# Patient Record
Sex: Male | Born: 1955 | Race: White | Hispanic: No | Marital: Married | Smoking: Current every day smoker
Health system: Southern US, Community
[De-identification: ages and names within clinical notes are randomized; demographics above are authoritative.]

## PROBLEM LIST (undated history)

## (undated) DIAGNOSIS — E785 Hyperlipidemia, unspecified: Secondary | ICD-10-CM

## (undated) DIAGNOSIS — E119 Type 2 diabetes mellitus without complications: Secondary | ICD-10-CM

## (undated) HISTORY — PX: THYROIDECTOMY, PARTIAL: SHX18

## (undated) HISTORY — DX: Hyperlipidemia, unspecified: E78.5

## (undated) HISTORY — DX: Type 2 diabetes mellitus without complications: E11.9

---

## 2000-01-11 ENCOUNTER — Ambulatory Visit (HOSPITAL_COMMUNITY): Admission: RE | Admit: 2000-01-11 | Discharge: 2000-01-11 | Payer: Self-pay | Admitting: *Deleted

## 2002-01-05 ENCOUNTER — Encounter: Payer: Self-pay | Admitting: General Surgery

## 2002-01-05 ENCOUNTER — Encounter: Admission: RE | Admit: 2002-01-05 | Discharge: 2002-01-05 | Payer: Self-pay | Admitting: General Surgery

## 2018-08-03 ENCOUNTER — Telehealth: Payer: Self-pay

## 2018-08-03 DIAGNOSIS — Z122 Encounter for screening for malignant neoplasm of respiratory organs: Secondary | ICD-10-CM

## 2018-08-03 DIAGNOSIS — F17210 Nicotine dependence, cigarettes, uncomplicated: Secondary | ICD-10-CM

## 2018-08-08 NOTE — Telephone Encounter (Signed)
Pt is returning [employer] phone call

## 2018-08-08 NOTE — Telephone Encounter (Signed)
LMTC x 1  

## 2018-08-09 NOTE — Telephone Encounter (Signed)
Pt is returning phone call ° °

## 2018-08-09 NOTE — Telephone Encounter (Signed)
Will route to the lung nodule pool 

## 2018-08-11 NOTE — Telephone Encounter (Signed)
LMTC x 1  

## 2018-08-15 NOTE — Telephone Encounter (Signed)
Spoke with pt and scheduled SDMV 09/20/18 9:00 CT ordered Nothing further needed

## 2018-09-20 ENCOUNTER — Ambulatory Visit (INDEPENDENT_AMBULATORY_CARE_PROVIDER_SITE_OTHER)
Admission: RE | Admit: 2018-09-20 | Discharge: 2018-09-20 | Disposition: A | Discharge status: Home / Self Care | Payer: Managed Care, Other (non HMO) | Source: Ambulatory Visit | Attending: Acute Care | Admitting: Acute Care

## 2018-09-20 ENCOUNTER — Other Ambulatory Visit: Payer: Self-pay

## 2018-09-20 ENCOUNTER — Encounter: Payer: Self-pay | Admitting: Acute Care

## 2018-09-20 ENCOUNTER — Ambulatory Visit (INDEPENDENT_AMBULATORY_CARE_PROVIDER_SITE_OTHER): Payer: Managed Care, Other (non HMO) | Admitting: Acute Care

## 2018-09-20 VITALS — BP 114/70 | HR 57 | Temp 97.6°F | Ht 73.0 in | Wt 278.4 lb

## 2018-09-20 DIAGNOSIS — Z122 Encounter for screening for malignant neoplasm of respiratory organs: Secondary | ICD-10-CM

## 2018-09-20 DIAGNOSIS — F17210 Nicotine dependence, cigarettes, uncomplicated: Secondary | ICD-10-CM | POA: Diagnosis not present

## 2018-09-20 NOTE — Progress Notes (Addendum)
Shared Decision Making Visit Lung Cancer Screening Program [phone])   Eligibility:  Age 63 y.o.  Pack Years Smoking History Calculation 33 pack year smoking history (# packs/per year x # years smoked)  Recent History of coughing up blood  no  Unexplained weight loss? no ( >Than 15 pounds within the last 6 months )  Prior History Lung / other cancer no (Diagnosis within the last 5 years already requiring surveillance chest CT Scans).  Smoking Status Current Smoker  Former Smokers: Years since quit: NA  Quit Date: NA  Visit Components:  Discussion included one or more decision making aids. yes  Discussion included risk/benefits of screening. yes  Discussion included potential follow up diagnostic testing for abnormal scans. yes  Discussion included meaning and risk of over diagnosis. yes  Discussion included meaning and risk of False Positives. yes  Discussion included meaning of total radiation exposure. yes  Counseling Included:  Importance of adherence to annual lung cancer LDCT screening. yes  Impact of comorbidities on ability to participate in the program. yes  Ability and willingness to under diagnostic treatment. yes  Smoking Cessation Counseling:  Current Smokers:   Discussed importance of smoking cessation. yes  Information about tobacco cessation classes and interventions provided to patient. yes  Patient provided with "ticket" for LDCT Scan. yes  Symptomatic Patient. no  Counseling  Diagnosis Code: Tobacco Use Z72.0  Asymptomatic Patient yes  Counseling (Intermediate counseling: > three minutes counseling) M0102  Former Smokers:   Discussed the importance of maintaining cigarette abstinence. yes  Diagnosis Code: Personal History of Nicotine Dependence. V25.366  Information about tobacco cessation classes and interventions provided to patient. Yes  Patient provided with "ticket" for LDCT Scan. yes  Written Order for Lung Cancer  Screening with LDCT placed in Epic. Yes (CT Chest Lung Cancer Screening Low Dose W/O CM) [id number] Z12.2-Screening of respiratory organs Z87.891-Personal history of nicotine dependence  BP 114/70 (BP Location: Right Arm, Patient Position: Sitting, Cuff Size: Large)   Pulse (!) 57   Temp 97.6 F (36.4 C) (Oral)   Ht 6\' 1"  (1.854 m)   Wt 278 lb 6.4 oz (126.3 kg)   SpO2 97%   BMI 36.73 kg/m     I have spent 25 minutes of face to face time with [name] discussing the risks and benefits of lung cancer screening. We viewed a power point together that explained in detail the above noted topics. We paused at intervals to allow for questions to be asked and answered to ensure understanding.We discussed that the single most powerful action that he can take to decrease his risk of developing lung cancer is to quit smoking. We discussed whether or not he is ready to commit to setting a quit date. We discussed options for tools to aid in quitting smoking including nicotine replacement therapy, non-nicotine medications, support groups, Quit Smart classes, and behavior modification. We discussed that often times setting smaller, more achievable goals, such as eliminating 1 cigarette a day for a week and then 2 cigarettes a day for a week can be helpful in slowly decreasing the number of cigarettes smoked. This allows for a sense of accomplishment as well as providing a clinical benefit. I gave him the " Be Stronger Than Your Excuses" card with contact information for community resources, classes, free nicotine replacement therapy, and access to mobile apps, text messaging, and on-line smoking cessation help. I have also given him my card and contact information in the event he needs to  contact me. We discussed the time and location of the scan, and that either [name] or I will call with the results within 24-48 hours of receiving them. I have offered him  a copy of the power point we viewed  as a  resource in the event they need reinforcement of the concepts we discussed today in the office. The patient verbalized understanding of all of  the above and had no further questions upon leaving the office. They have my contact information in the event they have any further questions.  I spent 5 minutes counseling on smoking cessation and the health risks of continued tobacco abuse.  I explained to the patient that there has been a high incidence of coronary artery disease noted on these exams. I explained that this is a non-gated exam therefore degree or severity cannot be determined. This patient is on statin therapy. I have asked the patient to follow-up with their PCP regarding any incidental finding of coronary artery disease and management with diet or medication as their PCP  feels is clinically indicated. The patient verbalized understanding of the above and had no further questions upon completion of the visit.      Bevelyn NgoSarah F Groce, NP 09/20/2018 9:31 AM

## 2018-09-22 ENCOUNTER — Telehealth: Payer: Self-pay | Admitting: Acute Care

## 2018-09-22 DIAGNOSIS — F17210 Nicotine dependence, cigarettes, uncomplicated: Secondary | ICD-10-CM

## 2018-09-22 DIAGNOSIS — Z122 Encounter for screening for malignant neoplasm of respiratory organs: Secondary | ICD-10-CM

## 2018-09-22 NOTE — Telephone Encounter (Signed)
Spoke to pt regarding Chest CT.  Advised per Eric Form, NP he needs a televisit to discuss results.  Televisit scheduled 09/25/18 9:00 with SG.  Pt verbalized understanding.  Order placed for 1 yr f/u low dose CT.  Results sent to PCP.

## 2018-09-22 NOTE — Telephone Encounter (Signed)
LMTC  X 1  

## 2018-09-25 ENCOUNTER — Other Ambulatory Visit: Payer: Self-pay

## 2018-09-25 ENCOUNTER — Ambulatory Visit (INDEPENDENT_AMBULATORY_CARE_PROVIDER_SITE_OTHER): Payer: Managed Care, Other (non HMO) | Admitting: Acute Care

## 2018-09-25 ENCOUNTER — Encounter: Payer: Self-pay | Admitting: Acute Care

## 2018-09-25 ENCOUNTER — Other Ambulatory Visit: Payer: Self-pay | Admitting: Family Medicine

## 2018-09-25 DIAGNOSIS — E049 Nontoxic goiter, unspecified: Secondary | ICD-10-CM | POA: Diagnosis not present

## 2018-09-25 DIAGNOSIS — Z122 Encounter for screening for malignant neoplasm of respiratory organs: Secondary | ICD-10-CM | POA: Diagnosis not present

## 2018-09-25 DIAGNOSIS — Z720 Tobacco use: Secondary | ICD-10-CM

## 2018-09-25 DIAGNOSIS — F17210 Nicotine dependence, cigarettes, uncomplicated: Secondary | ICD-10-CM

## 2018-09-25 DIAGNOSIS — E041 Nontoxic single thyroid nodule: Secondary | ICD-10-CM

## 2018-09-25 NOTE — Progress Notes (Signed)
Virtual Visit via Telephone Note  I connected with [name] on 09/25/18 at  9:00 AM EDT by telephone and verified that I am speaking with the correct person using two identifiers.  I  confirmed date of birth and address to authenticate  Identity. My nurse Quentin Ore reviewed medications and ordered any refills required.   Location: Patient: At Home Provider: In the office at [place], [place], [place]. [address].   I discussed the limitations, risks, security and privacy concerns of performing an evaluation and management service by telephone and the availability of in person appointments. I also discussed with the patient that there may be a patient responsible charge related to this service. The patient expressed understanding and agreed to proceed.   History of Present Illness: Pt. Presents for follow up of LDCT scan. He had his LDCT 09/20/2018. It was read as a Lung RADS 2: nodules that are benign in appearance and behavior with a very low likelihood of becoming a clinically active cancer due to size or lack of growth. Recommendation per radiology is for a repeat LDCT in 12 months. We will schedule him for a 12 month  Annual scan 09/2018. I explained that this was a good result.  I wanted to talk with him regarding an incidental finding of an asymmetric enlargement of the right thyroid lobe. There was notation of probable 3.3 cm right thyroid nodule with another 14 mm incompletely visualized nodule in the right thyroid lobe. Thyroid ultrasound recommended to further evaluate. I discussed this with the patient. I explained that the recommendation was for an ultrasound of his thyroid to further evaluate.I explained that I will call his PCP's office and make sure she knows he needs this follow up ultrasound.He verbalized understanding of the above and had no further questions at completion of the call. I gave him out contact information in the event he had any questions in the  future.     Observations/Objective: IMPRESSION: 1. Lung-RADS 2, benign appearance or behavior. Continue annual screening with low-dose chest CT without contrast in 12 months. 2.  Aortic Atherosclerois (ICD10-170.0) 3. Patchy areas of ground-glass attenuation in both lungs, nonspecific but potentially related to infectious/inflammatory alveolitis.  ADDENDUM: Asymmetric enlargement of the right thyroid lobe, as described in the body of the report. Probable 3.3 cm right thyroid nodule with another 14 mm incompletely visualized nodule in the right thyroid lobe. Thyroid ultrasound recommended to further evaluate.  Assessment and Plan: Low Dose CT Chest Plan Lung RADS 2 We will schedule annual Lung Cancer Screening for 09/2019 We will fax results to PCP  Incidental Finding of Asymmetric enlargement of the right thyroid lobe,  Probable 3.3 cm right thyroid nodule with another 14 mm incompletely visualized nodule in the right thyroid lobe.  Plan I have called Dr. Drema Dallas office. They had already received the report and are in the process of scheduling US of the thyroid.  I spoke with [place] at the office and confirmed they were working on follow up imaging as recommended in the radiology report..   Tobacco Abuse Current Every Day smoker Plan I have spent 3 minutes counseling patient on smoking cessation this visit. Patient verbalizes understanding that continuing to  smoke has  negative health consequences including worsening of COPD, risk of lung cancer , stroke and heart disease..    Follow Up Instructions: Follow up Thyroid US Follow up CT Chest 09/2019   I discussed the assessment and treatment plan with the patient.  The patient was provided an opportunity to ask questions and all were answered. The patient agreed with the plan and demonstrated an understanding of the instructions.   The patient was advised to call back or seek an in-person evaluation if the symptoms worsen  or if the condition fails to improve as anticipated.  I provided 25  minutes of non-face-to-face time during this encounter.( included calling PCP and ensuring follow up imaging  of incidental finding. )   Bevelyn NgoSarah F Groce, NP 09/25/2018 9:26 AM

## 2018-09-25 NOTE — Patient Instructions (Addendum)
Thank you for talking with me today. We will schedule your annual lung cancer screening CT scan for 09/2019. You will get a call closer to the time to schedule the scan. I have called Dr. Drema Dallas office and they were working on getting an [place] scheduled.( I spoke with [place]). You should get a phone call from them to schedule. If you do not get a phone call within the next few days, please call Dr. Drema Dallas office as follow up. Please work on quitting smoking.  This is the single most powerful action you can take to decrease your risk of lung cancer, pulmonary disease, heart disease, and  stroke . Please let me know if we can be of any assistance in helping with smoking cessation.

## 2018-09-27 ENCOUNTER — Ambulatory Visit (INDEPENDENT_AMBULATORY_CARE_PROVIDER_SITE_OTHER): Payer: Managed Care, Other (non HMO)

## 2018-09-27 ENCOUNTER — Other Ambulatory Visit: Payer: Self-pay

## 2018-09-27 DIAGNOSIS — E041 Nontoxic single thyroid nodule: Secondary | ICD-10-CM | POA: Diagnosis not present

## 2019-06-22 ENCOUNTER — Other Ambulatory Visit: Payer: Self-pay | Admitting: Family Medicine

## 2019-06-22 DIAGNOSIS — R9389 Abnormal findings on diagnostic imaging of other specified body structures: Secondary | ICD-10-CM

## 2019-06-28 ENCOUNTER — Other Ambulatory Visit: Payer: Self-pay | Admitting: Family Medicine

## 2019-06-28 DIAGNOSIS — R9389 Abnormal findings on diagnostic imaging of other specified body structures: Secondary | ICD-10-CM

## 2019-07-10 ENCOUNTER — Inpatient Hospital Stay: Admission: RE | Admit: 2019-07-10 | Payer: Managed Care, Other (non HMO) | Source: Ambulatory Visit

## 2019-07-26 ENCOUNTER — Encounter: Payer: Managed Care, Other (non HMO) | Attending: Family Medicine | Admitting: Dietician

## 2019-07-26 ENCOUNTER — Other Ambulatory Visit: Payer: Self-pay

## 2019-07-26 DIAGNOSIS — E119 Type 2 diabetes mellitus without complications: Secondary | ICD-10-CM | POA: Diagnosis present

## 2019-07-27 ENCOUNTER — Encounter: Payer: Self-pay | Admitting: Dietician

## 2019-07-27 NOTE — Progress Notes (Signed)
Patient was seen on 07/26/2019 for the first of a series of three diabetes self-management courses at the [facility].  Patient Education Plan per assessed needs and concerns is to attend three course education program for Diabetes Self Management Education.  The following learning objectives were met by the patient during this class:  Describe diabetes, types of diabetes and pathophysiology  State some common risk factors for diabetes  Defines the role of glucose and insulin  Describe the relationship between diabetes and cardiovascular and other risks  State the members of the Healthcare Team  States the rationale for glucose monitoring and when to test  State their individual Target Range  State the importance of logging glucose readings and how to interpret the readings  Identifies A1C target  Explain the correlation between A1c and eAG values  State symptoms and treatment of high blood glucose and low blood glucose  Explain proper technique for glucose testing and identify proper sharps disposal  Handouts given during class include:  How to Thrive:  A Guide for Your Journey with Diabetes by the ADA  Meal Plan Card and carbohydrate content list  Dietary intake form  Low Sodium Flavoring Tips  Types of Fats  Dining Out  Label reading  Snack list  Planning a balanced meal  The diabetes portion plate  Diabetes Resources  A1c to eAG Conversion Chart  Blood Glucose Log  Diabetes Recommended Care Schedule  Support Group  Diabetes Success Plan  Core Class Satisfaction Survey   Follow-Up Plan:  Attend core 2   

## 2019-08-02 ENCOUNTER — Other Ambulatory Visit: Payer: Self-pay

## 2019-08-02 ENCOUNTER — Encounter: Payer: Managed Care, Other (non HMO) | Admitting: Dietician

## 2019-08-02 DIAGNOSIS — E119 Type 2 diabetes mellitus without complications: Secondary | ICD-10-CM

## 2019-08-03 ENCOUNTER — Encounter: Payer: Self-pay | Admitting: Dietician

## 2019-08-03 NOTE — Progress Notes (Signed)
Patient was seen on 08/02/19 for the second of a series of three diabetes self-management courses at the [facility]. The following learning objectives were met by the patient during this class:   Describe the role of different macronutrients on glucose  Explain how carbohydrates affect blood glucose  State what foods contain the most carbohydrates  Demonstrate carbohydrate counting  Demonstrate how to read Nutrition Facts food label  Describe effects of various fats on heart health  Describe the importance of good nutrition for health and healthy eating strategies  Describe techniques for managing your shopping, cooking and meal planning  List strategies to follow meal plan when dining out  Describe the effects of alcohol on glucose and how to use it safely  Goals:  Follow Diabetes Meal Plan as instructed  Aim to spread carbs evenly throughout the day  Aim for 3 meals per day and snacks as needed Include lean protein foods to meals/snacks  Monitor glucose levels as instructed by your doctor   Follow-Up Plan:  Attend Core 3  Work towards following your personal food plan.

## 2019-08-09 ENCOUNTER — Ambulatory Visit: Payer: Managed Care, Other (non HMO)

## 2019-09-06 ENCOUNTER — Other Ambulatory Visit: Payer: Self-pay

## 2019-09-06 ENCOUNTER — Encounter: Payer: Managed Care, Other (non HMO) | Attending: Family Medicine | Admitting: Dietician

## 2019-09-06 DIAGNOSIS — E119 Type 2 diabetes mellitus without complications: Secondary | ICD-10-CM | POA: Diagnosis not present

## 2019-09-07 ENCOUNTER — Encounter: Payer: Self-pay | Admitting: Dietician

## 2019-09-07 NOTE — Progress Notes (Signed)
Patient was seen on 09/06/2019 for the third of a series of three diabetes self-management courses at the [facility].   [name] the amount of activity recommended for healthy living . Describe activities suitable for individual needs . Identify ways to regularly incorporate activity into daily life . Identify barriers to activity and ways to over come these barriers  Identify diabetes medications being personally used and their primary action for lowering glucose and possible side effects . Describe role of stress on blood glucose and develop strategies to address psychosocial issues . Identify diabetes complications and ways to prevent them  Explain how to manage diabetes during illness . Evaluate success in meeting personal goal . Establish 2-3 goals that they will plan to diligently work on  Goals:   I will count my carb choices at most meals and snacks  Reduce my carbohydrate intake at breakfast and lunch  I will be active 30 minutes or more 5 times a week  I will eat less unhealthy fats   Your patient has identified these potential barriers to change:  none  Your patient has identified their diabetes self-care support plan as   none    Plan:  Attend Support Group as desired

## 2019-10-01 ENCOUNTER — Ambulatory Visit (INDEPENDENT_AMBULATORY_CARE_PROVIDER_SITE_OTHER)
Admission: RE | Admit: 2019-10-01 | Discharge: 2019-10-01 | Disposition: A | Discharge status: Home / Self Care | Payer: Managed Care, Other (non HMO) | Source: Ambulatory Visit | Attending: Acute Care | Admitting: Acute Care

## 2019-10-01 ENCOUNTER — Other Ambulatory Visit: Payer: Self-pay

## 2019-10-01 DIAGNOSIS — Z122 Encounter for screening for malignant neoplasm of respiratory organs: Secondary | ICD-10-CM | POA: Diagnosis not present

## 2019-10-01 DIAGNOSIS — F17210 Nicotine dependence, cigarettes, uncomplicated: Secondary | ICD-10-CM | POA: Diagnosis not present

## 2019-10-02 NOTE — Progress Notes (Signed)
Please call patient and let them  know their  low dose Ct was read as a Lung RADS 2: nodules that are benign in appearance and behavior with a very low likelihood of becoming a clinically active cancer due to size or lack of growth. Recommendation per radiology is for a repeat LDCT in 12 months. .Please let them  know we will order and schedule their  annual screening scan for 09/2020. Please let them  know there was notation of CAD on their  scan.  Please remind the patient  that this is a non-gated exam therefore degree or severity of disease  cannot be determined. Please have them  follow up with their PCP regarding potential risk factor modification, dietary therapy or pharmacologic therapy if clinically indicated. Pt.  is  currently on statin therapy. Please place order for annual  screening scan for  09/2020 and fax results to PCP. Thanks so much. 

## 2019-10-10 ENCOUNTER — Other Ambulatory Visit: Payer: Self-pay | Admitting: *Deleted

## 2019-10-10 DIAGNOSIS — F17210 Nicotine dependence, cigarettes, uncomplicated: Secondary | ICD-10-CM

## 2020-10-09 ENCOUNTER — Telehealth: Payer: Self-pay | Admitting: Acute Care

## 2020-10-09 DIAGNOSIS — Z87891 Personal history of nicotine dependence: Secondary | ICD-10-CM

## 2020-10-09 DIAGNOSIS — F17210 Nicotine dependence, cigarettes, uncomplicated: Secondary | ICD-10-CM

## 2020-10-10 NOTE — Telephone Encounter (Signed)
Angelique Blonder I will need a new LCS CT order to get his CT scheduled

## 2020-10-13 NOTE — Telephone Encounter (Signed)
I have left a message for the patient to call to schedule his LCS CT

## 2020-10-13 NOTE — Telephone Encounter (Signed)
New CT order has been placed.  

## 2020-10-16 NOTE — Telephone Encounter (Signed)
I finally got to speak with Mr. [name] today and his LCS CT has been scheduled for 10/23/20 @ 3:00pm and he is aware

## 2020-10-23 ENCOUNTER — Ambulatory Visit (INDEPENDENT_AMBULATORY_CARE_PROVIDER_SITE_OTHER)
Admission: RE | Admit: 2020-10-23 | Discharge: 2020-10-23 | Disposition: A | Discharge status: Home / Self Care | Payer: Managed Care, Other (non HMO) | Source: Ambulatory Visit | Attending: Acute Care | Admitting: Acute Care

## 2020-10-23 ENCOUNTER — Other Ambulatory Visit: Payer: Self-pay

## 2020-10-23 DIAGNOSIS — F17210 Nicotine dependence, cigarettes, uncomplicated: Secondary | ICD-10-CM

## 2020-10-23 DIAGNOSIS — Z87891 Personal history of nicotine dependence: Secondary | ICD-10-CM | POA: Diagnosis not present

## 2020-10-30 ENCOUNTER — Telehealth: Payer: Self-pay | Admitting: Acute Care

## 2020-10-30 NOTE — Progress Notes (Signed)
I have attempted to call the patient with the results of his low dose CT. There was no answer. I have left a HIPPA compliant message requesting the patient call the office for the results Angelique Blonder, if he calls, the scan is a Lung  RADS 3, nodules that are probably benign findings, short term follow up suggested: includes nodules with a low likelihood of becoming a clinically active cancer. Radiology recommends a 6 month repeat LDCT follow up.  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 If he is symptomatic , he needs consult with surgery to consider resection.   He has had the goiter [place] in the past.  He also has progressive ILD, and needs pulmonary consult. We need to place a pulmonary consult once he has called for his results.  Thanks

## 2020-10-30 NOTE — Telephone Encounter (Signed)
Pt is returning phone call in regards to Lung Cancer Screening CT scan. Pls regard; [phone]

## 2020-10-31 ENCOUNTER — Other Ambulatory Visit: Payer: Self-pay | Admitting: Acute Care

## 2020-10-31 DIAGNOSIS — J849 Interstitial pulmonary disease, unspecified: Secondary | ICD-10-CM

## 2020-10-31 NOTE — Progress Notes (Signed)
I have called the patient with the results of his low dose CT Chest. I explained that his scan was read as a Lung  RADS 3, nodules that are probably benign findings, short term follow up suggested: includes nodules with a low likelihood of becoming a clinically active cancer. Radiology recommends a 6 month repeat LDCT follow up. I explained that we will do a follow up CT Chest in 6 months.  In additiona I spoke with the patient about the findings of ILD. He has not been told he had changes of ILD in the past.He does have shortness of breath with exertion, but he has always thought this was because of  his age and the fact he smokes. He is in agreement with Pulmonary Consult for PFT and to follow this for progression . I have also spoken with the patient about the finding of his Goiter ( Which has been ultra sounded in the past) and the fact that it is appearing to enlarge over time. Per this years scan, there is significant compression of the trachea which is very narrowed and slit like in appearance. Recommendation was that if patient is symptomatic, surgical consultation should be considered for resection. I have asked the patient to reach out to his PCP, to make an appointment to evaluate for need of surgical consultation. He has agreed to do this. Angelique Blonder, I will place order for Pulm Consult, if you will fax results to PCP and place the order for the follow up scan. Thanks

## 2020-10-31 NOTE — Progress Notes (Signed)
I have called [facility]. They are aware of the finding or tracheal narrowing. They will call the patient to make an appointment to better evaluate for symptoms and determine need for surgical evaluation or follow up.

## 2020-11-03 ENCOUNTER — Other Ambulatory Visit: Payer: Self-pay | Admitting: *Deleted

## 2020-11-03 DIAGNOSIS — Z87891 Personal history of nicotine dependence: Secondary | ICD-10-CM

## 2020-11-03 DIAGNOSIS — F17210 Nicotine dependence, cigarettes, uncomplicated: Secondary | ICD-10-CM

## 2020-11-06 NOTE — Telephone Encounter (Signed)
See CT result note

## 2021-01-12 ENCOUNTER — Telehealth: Payer: Self-pay | Admitting: Acute Care

## 2021-01-13 NOTE — Telephone Encounter (Signed)
Lm for patient.  

## 2021-01-13 NOTE — Telephone Encounter (Signed)
Spoke with the pt  Verified that he wanted this sent to the [employer] st location  I have faxed via Epic to Dr Colvin Caroli st  Nothing further needed

## 2021-02-11 ENCOUNTER — Institutional Professional Consult (permissible substitution): Payer: Managed Care, Other (non HMO) | Admitting: Pulmonary Disease

## 2021-02-26 ENCOUNTER — Ambulatory Visit (INDEPENDENT_AMBULATORY_CARE_PROVIDER_SITE_OTHER): Payer: Managed Care, Other (non HMO) | Admitting: Pulmonary Disease

## 2021-02-26 ENCOUNTER — Encounter: Payer: Self-pay | Admitting: Pulmonary Disease

## 2021-02-26 ENCOUNTER — Other Ambulatory Visit: Payer: Self-pay

## 2021-02-26 VITALS — BP 136/72 | HR 66 | Temp 98.1°F | Ht 73.0 in | Wt 274.6 lb

## 2021-02-26 DIAGNOSIS — F17210 Nicotine dependence, cigarettes, uncomplicated: Secondary | ICD-10-CM | POA: Diagnosis not present

## 2021-02-26 DIAGNOSIS — J849 Interstitial pulmonary disease, unspecified: Secondary | ICD-10-CM | POA: Diagnosis not present

## 2021-02-26 NOTE — Patient Instructions (Signed)
We get a high-resolution CT PFTs and lab tests for further evaluation of the lung disease Follow-up in clinic in 1 to 2 months after PFTs any surgery for re evaluation plan for next steps

## 2021-02-26 NOTE — Progress Notes (Signed)
[name]    [id number]    1955-04-03  Primary Care Physician:[facility]  Referring Physician: Bevelyn Ngo, NP [address] [place],  [place] [address]  Chief complaint: Consult for abnormal CT, evaluation for ILD  HPI: 65 year old with history of multinodular goiter, hyperlipidemia, diabetes, psoriasis Referred for evaluation of abnormal screening CT of the chest with concern for interstitial lung disease  Complains of chronic cough for many years which is nonproductive.  He has dyspnea on exertion He is an active smoker.  Recently evaluated by Dr. Suszanne Conners and Dr.Waltonen, ENT at [facility] for multinodular goiter with tracheal compression and mild inspiratory stridor.  Plan is for thyroidectomy in early January.  He has history of psoriasis for which she takes over-the-counter steroid cream Recently after Thanksgiving he had an episode of viral respiratory illness, tested COVID-negative.  Treated with over-the-counter medications as an outpatient.  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  No mold, hot tub, Jacuzzi.  No feather pillows or comforter ILD questionnaire 02/26/2021-negative Smoking history: 30-pack-year smoker.  Continues to smoke half pack per day Travel history: No significant travel history Relevant family history: No family history of lung disease  Outpatient Encounter Medications as of 02/26/2021  Medication Sig   atorvastatin (LIPITOR) 20 MG tablet Take 20 mg by mouth daily.   hydrocortisone cream 1 % Apply 1 application topically 3 times/day as needed-between meals & bedtime for itching.   [DISCONTINUED] augmented betamethasone dipropionate (DIPROLENE-AF) 0.05 % cream    [DISCONTINUED] fluticasone (FLONASE) 50 MCG/ACT nasal spray PLACE 1 SPRAY INTO EACH NOSTRIL EVERY DAY   No facility-administered  encounter medications on file as of 02/26/2021.    Allergies as of 02/26/2021 - Review Complete 02/26/2021  Allergen Reaction Noted   Penicillins Other (See Comments) 09/20/2018    Past Medical History:  Diagnosis Date   Diabetes mellitus without complication (HCC)    Hyperlipidemia     No past surgical history on file.  No family history on file.  Social History   Socioeconomic History   Marital status: Married    Spouse name: Not on file   Number of children: Not on file   Years of education: Not on file   Highest education level: Not on file  Occupational History   Not on file  Tobacco Use   Smoking status: Every Day    Packs/day: 0.50    Years: 48.00    Pack years: 24.00    Types: Cigarettes    Start date: 10/22/1970   Smokeless tobacco: Current    Types: Snuff  Substance and Sexual Activity   Alcohol use: Not on file   Drug use: Not on file   Sexual activity: Not on file  Other Topics Concern   Not on file  Social History Narrative   Not on file   Social Determinants of Health   Financial Resource Strain: Not on file  Food Insecurity: Not on file  Transportation Needs: Not on file  Physical Activity: Not on file  Stress: Not on file  Social Connections: Not on file  Intimate Partner Violence: Not on file    Review of systems: Review of Systems  Constitutional: Negative for fever and chills.  HENT: Negative.   Eyes: Negative for blurred vision.  Respiratory: as per HPI  Cardiovascular: Negative for chest pain and  palpitations.  Gastrointestinal: Negative for vomiting, diarrhea, blood per rectum. Genitourinary: Negative for dysuria, urgency, frequency and hematuria.  Musculoskeletal: Negative for myalgias, back pain and joint pain.  Skin: Negative for itching and rash.  Neurological: Negative for dizziness, tremors, focal weakness, seizures and loss of consciousness.  Endo/Heme/Allergies: Negative for environmental allergies.   Psychiatric/Behavioral: Negative for depression, suicidal ideas and hallucinations.  All other systems reviewed and are negative.  Physical Exam: Blood pressure 136/72, pulse 66, temperature 98.1 F (36.7 C), temperature source Oral, height 6\' 1"  (1.854 m), weight 274 lb 9.6 oz (124.6 kg), SpO2 98 %. Gen:      No acute distress HEENT:  EOMI, sclera anicteric Neck:     No masses; no thyromegaly Lungs:    Clear to auscultation bilaterally; normal respiratory effort CV:         Regular rate and rhythm; no murmurs Abd:      + bowel sounds; soft, non-tender; no palpable masses, no distension Ext:    No edema; adequate peripheral perfusion Skin:      Warm and dry; no rash Neuro: alert and oriented x 3 Psych: normal mood and affect  Data Reviewed: Imaging: CT low-dose 10/23/2020-multiple pulmonary nodules some of which are new.  Mild centrilobular and paraseptal emphysema.  Bilateral patchy groundglass attenuation and septal thickening in the mid to lower lungs which is worsened compared opacities.  I have reviewed the images personally.  PFTs:  Labs:  Assessment:  Consult for interstitial lung disease I have reviewed his CT with nonspecific groundglass changes at the base.  He does not have signs and symptoms of connective tissue disease.  He does have some chemical exposure in his line of work but does not appear to be significant.  CT findings are not entirely consistent with smoking-related changes  We will get baseline labs for CTD serologies, schedule high-res CT and PFTs  He is scheduled for thyroidectomy surgery in January.  I believe he will be able to tolerate the procedure and the work-up for interstitial lung disease need not delay his planned surgery, especially since this is causing tracheal compression and stridor  Active smoker Smoking cessation discussed.  He is not interested in quitting at present Reevaluate at return visit.  Time spent counseling-5  minutes  Plan/Recommendations: CT, PFTs CTD labs  [name] [facility] 02/26/2021, 4:05 PM  CC: Magdalen Spatz, NP

## 2021-03-03 LAB — SJOGREN'S SYNDROME ANTIBODS(SSA + SSB)
SSA (Ro) (ENA) Antibody, IgG: <1 AI
SSB (La) (ENA) Antibody, IgG: <1 AI

## 2021-03-03 LAB — ANA,IFA RA DIAG PNL W/RFLX TIT/PATN
Anti Nuclear Antibody (ANA): NEGATIVE
Cyclic Citrullin Peptide Ab: <16 UNITS
Rheumatoid fact SerPl-aCnc: <14 IU/mL (ref <14)

## 2021-03-03 LAB — ANCA SCREEN W REFLEX TITER: ANCA Screen: NEGATIVE

## 2021-03-03 LAB — ANTI-SCLERODERMA ANTIBODY: Scleroderma (Scl-70) (ENA) Antibody, IgG: <1 AI

## 2021-03-05 LAB — HYPERSENSITIVITY PNEUMONITIS
A. Pullulans Abs: NEGATIVE
Micropolyspora faeni, IgG: NEGATIVE
Pigeon Serum Abs: NEGATIVE
Thermoact. Saccharii: NEGATIVE
Thermoactinomyces vulgaris, IgG: NEGATIVE

## 2021-03-17 ENCOUNTER — Ambulatory Visit (INDEPENDENT_AMBULATORY_CARE_PROVIDER_SITE_OTHER)
Admission: RE | Admit: 2021-03-17 | Discharge: 2021-03-17 | Disposition: A | Discharge status: Home / Self Care | Payer: Managed Care, Other (non HMO) | Source: Ambulatory Visit | Attending: Pulmonary Disease | Admitting: Pulmonary Disease

## 2021-03-17 ENCOUNTER — Other Ambulatory Visit: Payer: Self-pay

## 2021-03-17 DIAGNOSIS — J849 Interstitial pulmonary disease, unspecified: Secondary | ICD-10-CM | POA: Diagnosis not present

## 2021-03-19 LAB — MYOMARKER 3 PLUS PROFILE (RDL)

## 2021-04-16 ENCOUNTER — Ambulatory Visit (INDEPENDENT_AMBULATORY_CARE_PROVIDER_SITE_OTHER): Payer: Managed Care, Other (non HMO) | Admitting: Pulmonary Disease

## 2021-04-16 ENCOUNTER — Ambulatory Visit: Payer: Managed Care, Other (non HMO) | Admitting: Pulmonary Disease

## 2021-04-16 ENCOUNTER — Other Ambulatory Visit: Payer: Self-pay

## 2021-04-16 ENCOUNTER — Encounter: Payer: Self-pay | Admitting: Pulmonary Disease

## 2021-04-16 VITALS — BP 122/58 | HR 68 | Temp 98.0°F | Ht 73.0 in | Wt 270.0 lb

## 2021-04-16 DIAGNOSIS — J849 Interstitial pulmonary disease, unspecified: Secondary | ICD-10-CM

## 2021-04-16 LAB — PULMONARY FUNCTION TEST
DL/VA % pred: 92 %
DL/VA: 3.76 ml/min/mmHg/L
DLCO cor % pred: 82 %
DLCO cor: 23.97 ml/min/mmHg
DLCO unc % pred: 86 %
DLCO unc: 24.93 ml/min/mmHg
FEF 25-75 Post: 4.48 L/sec
FEF 25-75 Pre: 3.95 L/sec
FEF2575-%Change-Post: 13 %
FEF2575-%Pred-Post: 153 %
FEF2575-%Pred-Pre: 135 %
FEV1-%Change-Post: 3 %
FEV1-%Pred-Post: 87 %
FEV1-%Pred-Pre: 84 %
FEV1-Post: 3.29 L
FEV1-Pre: 3.18 L
FEV1FVC-%Change-Post: 2 %
FEV1FVC-%Pred-Pre: 114 %
FEV6-%Change-Post: -1 %
FEV6-%Pred-Post: 77 %
FEV6-%Pred-Pre: 78 %
FEV6-Post: 3.71 L
FEV6-Pre: 3.75 L
FEV6FVC-%Pred-Post: 105 %
FEV6FVC-%Pred-Pre: 105 %
FVC-%Change-Post: 0 %
FVC-%Pred-Post: 74 %
FVC-%Pred-Pre: 74 %
FVC-Post: 3.77 L
FVC-Pre: 3.75 L
Post FEV1/FVC ratio: 87 %
Post FEV6/FVC ratio: 100 %
Pre FEV1/FVC ratio: 85 %
Pre FEV6/FVC Ratio: 100 %
RV % pred: 61 %
RV: 1.56 L
TLC % pred: 76 %
TLC: 5.81 L

## 2021-04-16 NOTE — Patient Instructions (Signed)
I have reviewed his CT scan which shows some mild inflammation which is likely from smoking He need to work on quitting smoking  Will order a follow-up high-res CT in 1 year Return to clinic in 6 months

## 2021-04-16 NOTE — Progress Notes (Signed)
Full PFT performed today. °

## 2021-04-16 NOTE — Patient Instructions (Signed)
Full PFT performed today. °

## 2021-04-16 NOTE — Progress Notes (Signed)
[name]    [id number]    Sep 09, 1955  Primary Care Physician:[facility]  Referring Physician: [facility] [address] [place],  [place] [address]  Chief complaint: Consult for abnormal CT, evaluation for ILD  HPI: 66 year old with history of multinodular goiter, hyperlipidemia, diabetes, psoriasis Referred for evaluation of abnormal screening CT of the chest with concern for interstitial lung disease  Complains of chronic cough for many years which is nonproductive.  He has dyspnea on exertion He is an active smoker.  Recently evaluated by Dr. Suszanne Conners and Dr.Waltonen, ENT at [facility] for multinodular goiter with tracheal compression and mild inspiratory stridor.  Plan is for thyroidectomy in early January.  He has history of psoriasis for which she takes over-the-counter steroid cream Recently after Thanksgiving he had an episode of viral respiratory illness, tested COVID-negative.  Treated with over-the-counter medications as an outpatient.  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  No mold, hot tub, Jacuzzi.  No feather pillows or comforter ILD questionnaire 02/26/2021-negative Smoking history: 30-pack-year smoker.  Continues to smoke half pack per day Travel history: No significant travel history Relevant family history: No family history of lung disease  Interim history: He underwent thyroidectomy at [facility] on 03/26/2021.  The surgery went well with no issues.  His stridor is improved He is here for follow-up CT, labs and PFTs States that breathing is doing well with no new complaints today  Outpatient Encounter Medications as of 04/16/2021  Medication Sig   atorvastatin (LIPITOR) 20 MG tablet Take 20 mg by mouth daily.   hydrocortisone cream 1 % Apply 1 application topically 3 times/day as  needed-between meals & bedtime for itching.   No facility-administered encounter medications on file as of 04/16/2021.   Physical Exam: Blood pressure (!) 122/58, pulse 68, temperature 98 F (36.7 C), temperature source Oral, height 6\' 1"  (1.854 m), weight 270 lb (122.5 kg), SpO2 96 %. Gen:      No acute distress HEENT:  EOMI, sclera anicteric Neck:     No masses; no thyromegaly Lungs:    Clear to auscultation bilaterally; normal respiratory effort CV:         Regular rate and rhythm; no murmurs Abd:      + bowel sounds; soft, non-tender; no palpable masses, no distension Ext:    No edema; adequate peripheral perfusion Skin:      Warm and dry; no rash Neuro: alert and oriented x 3 Psych: normal mood and affect   Data Reviewed: Imaging: CT low-dose 10/23/2020-multiple pulmonary nodules some of which are new.  Mild centrilobular and paraseptal emphysema.  Bilateral patchy groundglass attenuation and septal thickening in the mid to lower lungs which is worsened compared opacities.   High-resolution CT 03/17/2021-peripheral basilar predominant groundglass with subpleural reticulation, no honeycombing.  Indeterminate for UIP.  May be RB ILD versus NSIP.  I have reviewed the images personally.  PFTs: 04/16/2021 FVC 3.77 [74%], FEV1 3.29 [84%], F/F 85, TLC 5.81 [76%], DLCO 24.93 [86%] I have reviewed the images personally.   Labs: CTD serologies 02/26/2021-negative  Assessment:  Follow-up for interstitial lung disease I have reviewed his CT with nonspecific groundglass changes at the base.  He does not have signs and symptoms of connective tissue disease and CTD serologies are negative.  He does have some chemical exposure in  his line of work but does not appear to be significant.    This may be RB ILD due to ongoing smoking.  Smoking cessation discussed.  We will continue to monitor closely  Active smoker Smoking cessation discussed.  He is not interested in quitting at  present Reevaluate at return visit.  Time spent counseling-5 minutes  Plan/Recommendations: Follow-up high-res CT in 1 year Return to clinic in 6 months for reassessment  Chilton Greathouse MD [facility] 04/16/2021, 4:04 PM  CC: [facility]

## 2022-04-13 ENCOUNTER — Ambulatory Visit
Admission: RE | Admit: 2022-04-13 | Discharge: 2022-04-13 | Disposition: A | Discharge status: Home / Self Care | Payer: Managed Care, Other (non HMO) | Source: Ambulatory Visit | Attending: Pulmonary Disease | Admitting: Pulmonary Disease

## 2022-04-13 DIAGNOSIS — J849 Interstitial pulmonary disease, unspecified: Secondary | ICD-10-CM

## 2022-04-23 ENCOUNTER — Ambulatory Visit: Payer: Managed Care, Other (non HMO) | Admitting: Pulmonary Disease

## 2022-04-23 ENCOUNTER — Encounter: Payer: Self-pay | Admitting: Pulmonary Disease

## 2022-04-23 VITALS — BP 120/68 | HR 56 | Temp 97.7°F | Ht 73.0 in | Wt 276.6 lb

## 2022-04-23 DIAGNOSIS — J849 Interstitial pulmonary disease, unspecified: Secondary | ICD-10-CM | POA: Diagnosis not present

## 2022-04-23 NOTE — Patient Instructions (Signed)
I am glad your breathing is stable CT looks stable as well Will get lung function test in 6 months and return to clinic after lung function test.

## 2022-04-23 NOTE — Progress Notes (Signed)
[name]    [id number]    17-May-1955  Primary Care Physician:[facility]  Referring Physician: [facility] [place],  [name] [address]  Chief complaint: Follow-up for abnormal CT, evaluation for ILD  HPI: 67 y.o. with history of multinodular goiter, hyperlipidemia, diabetes, psoriasis Referred for evaluation of abnormal screening CT of the chest with concern for interstitial lung disease  Complains of chronic cough for many years which is nonproductive.  He has dyspnea on exertion He is an active smoker.  Recently evaluated by Dr. Benjamine Mola and Dr.Waltonen, ENT at [facility] for multinodular goiter with tracheal compression and mild inspiratory stridor.   He underwent thyroidectomy at [facility] on 03/26/2021.  The surgery went well with no issues.  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  No mold, hot tub, Jacuzzi.  No feather pillows or comforter ILD questionnaire 02/26/2021-negative Smoking history: 30-pack-year smoker.  Continues to smoke half pack per day Travel history: No significant travel history Relevant family history: No family history of lung disease  Interim history: He is here for follow-up CT States that breathing is doing well with no new complaints today  Outpatient Encounter Medications as of 04/23/2022  Medication Sig   atorvastatin (LIPITOR) 20 MG tablet Take 20 mg by mouth daily.   betamethasone dipropionate 0.05 % cream Apply topically daily.   [DISCONTINUED] hydrocortisone cream 1 % Apply 1 application topically 3 times/day as needed-between meals & bedtime for itching.   No facility-administered encounter medications on file as of 04/23/2022.   Physical Exam: Blood  pressure (!) 122/58, pulse 68, temperature 98 F (36.7 C), temperature source Oral, height 6' 1"$  (1.854 m), weight 270 lb (122.5 kg), SpO2 96 %. Gen:      No acute distress HEENT:  EOMI, sclera anicteric Neck:     No masses; no thyromegaly Lungs:    Clear to auscultation bilaterally; normal respiratory effort CV:         Regular rate and rhythm; no murmurs Abd:      + bowel sounds; soft, non-tender; no palpable masses, no distension Ext:    No edema; adequate peripheral perfusion Skin:      Warm and dry; no rash Neuro: alert and oriented x 3 Psych: normal mood and affect   Data Reviewed: Imaging: CT low-dose 10/23/2020-multiple pulmonary nodules some of which are new.  Mild centrilobular and paraseptal emphysema.  Bilateral patchy groundglass attenuation and septal thickening in the mid to lower lungs which is worsened compared opacities.   High-resolution CT 03/17/2021-peripheral basilar predominant groundglass with subpleural reticulation, no honeycombing.  Indeterminate for UIP.  May be RB ILD versus NSIP.  High-resolution CT 04/13/2022-stable interstitial lung disease in alternate pattern.  No progression compared to 2023. I have reviewed the images personally.  PFTs: 04/16/2021 FVC 3.77 [74%], FEV1 3.29 [84%], F/F 85, TLC 5.81 [76%], DLCO 24.93 [86%] I have reviewed the images personally.  Labs: CTD serologies 02/26/2021-negative  Assessment:  Follow-up for interstitial lung disease I have reviewed his CT with nonspecific groundglass changes at the base.  He does not have signs and symptoms of connective tissue disease and CTD serologies are negative.  He does have some chemical exposure in his line of work  but does not appear to be significant.    This may be RB ILD due to ongoing smoking.  Smoking cessation discussed.  We will continue to monitor closely  We also discussed further workup such as surgical lung biopsy or bronchoscopy with BAL and transbronchial biopsies but he  wants to avoid invasive procedures and continue conservative management.  Active smoker Smoking cessation discussed.  He is not interested in quitting at present Reevaluate at return visit.  Time spent counseling-5 minutes  Plan/Recommendations: Get PFTs in 6 months Return to clinic in 6 months for reassessment  [name] [facility] 04/23/2022, 8:42 AM  CC: [facility]

## 2022-12-30 IMAGING — CT CT CHEST LUNG CANCER SCREENING LOW DOSE W/O CM
2 of 4 series · 14 of 36 positions shown, 17 images · non-contrast
Comparison: Low-dose lung cancer screening chest CT 09/21/2019.

CLINICAL DATA: 65-year-old male current smoker with 35 pack-year
history of smoking. Lung cancer screening examination.

EXAM:
CT CHEST WITHOUT CONTRAST LOW-DOSE FOR LUNG CANCER SCREENING
TECHNIQUE: Multidetector CT imaging of the chest was performed following the
standard protocol without IV contrast.

[Series 3: lung thins 1.0 · axial · 0.84mm/px · z∈[-371,-73]mm · 11 of 328 slices shown, 14 images]
[im 15/328  mediastinal]
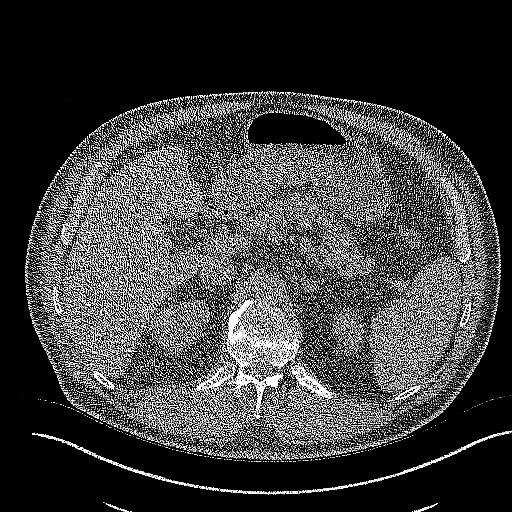
[im 15/328  lung]
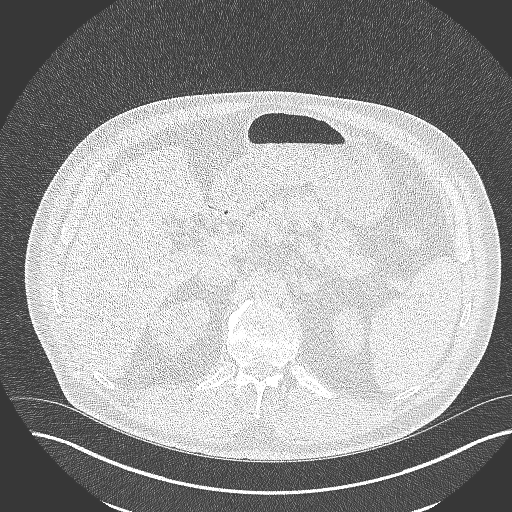
[im 45/328  lung]
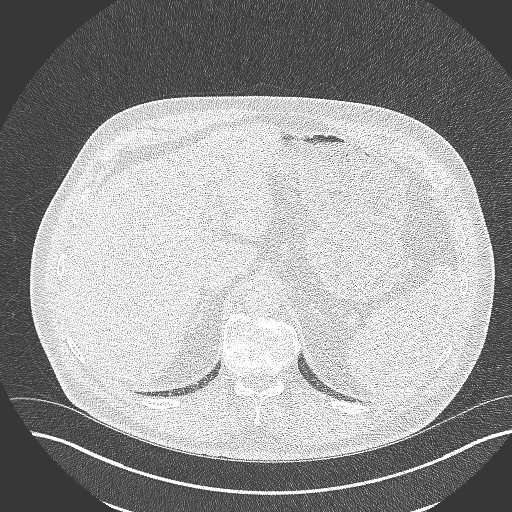
[im 75/328  lung]
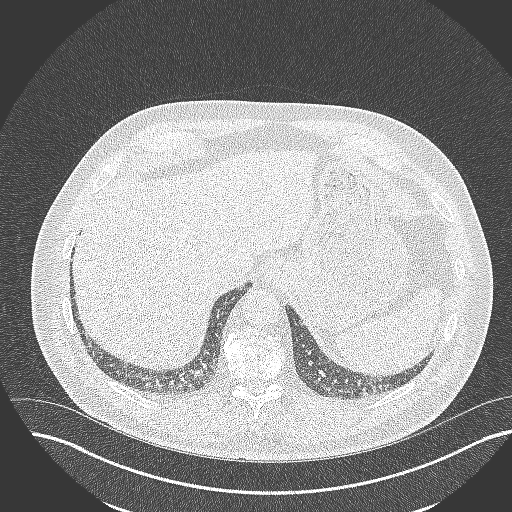
[im 105/328  lung]
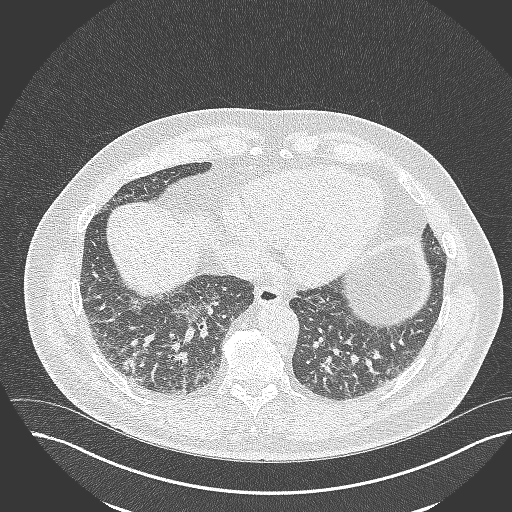
[im 134/328  mediastinal]
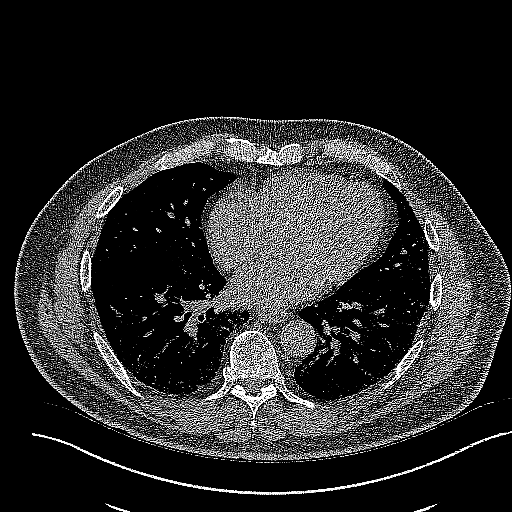
[im 134/328  lung]
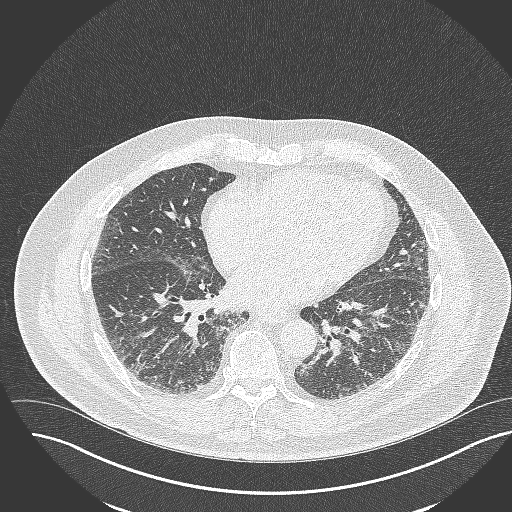
[im 164/328  lung]
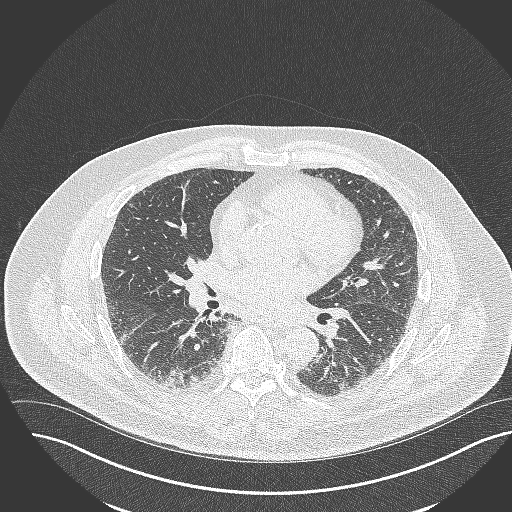
[im 194/328  lung]
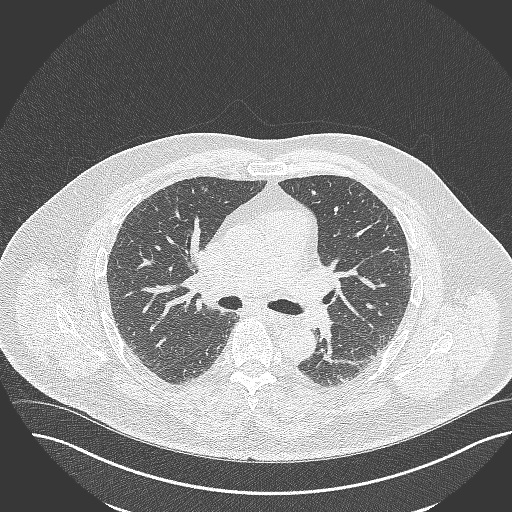
[im 223/328  lung]
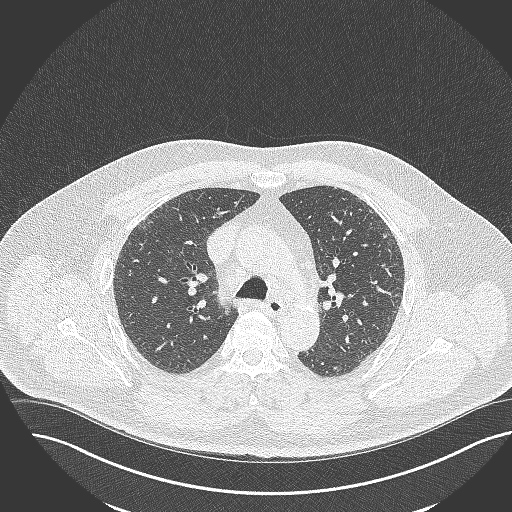
[im 253/328  mediastinal]
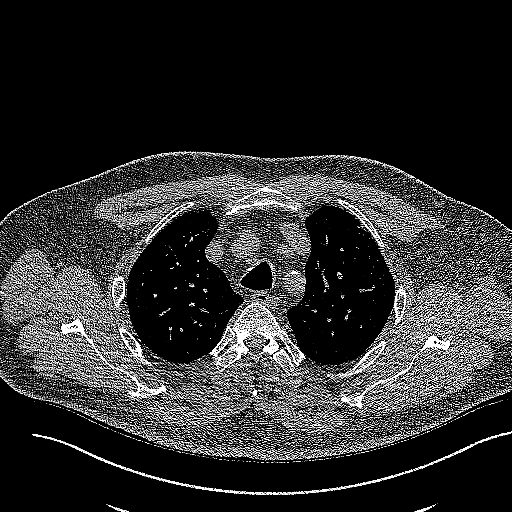
[im 253/328  lung]
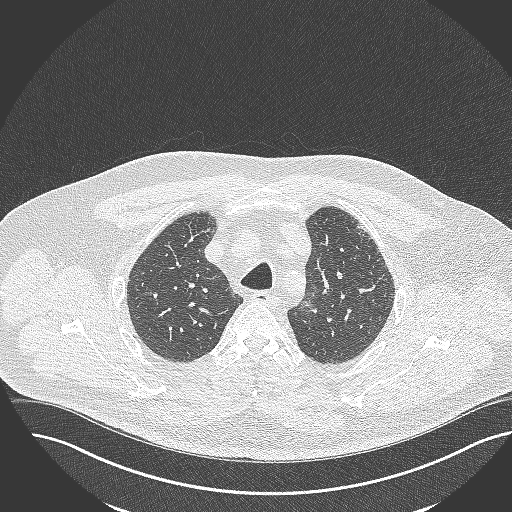
[im 283/328  lung]
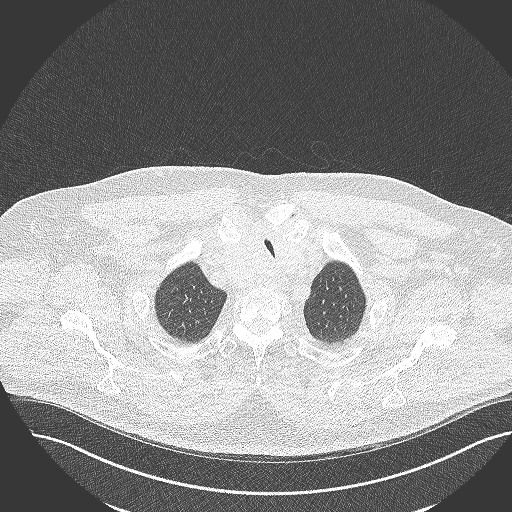
[im 313/328  lung]
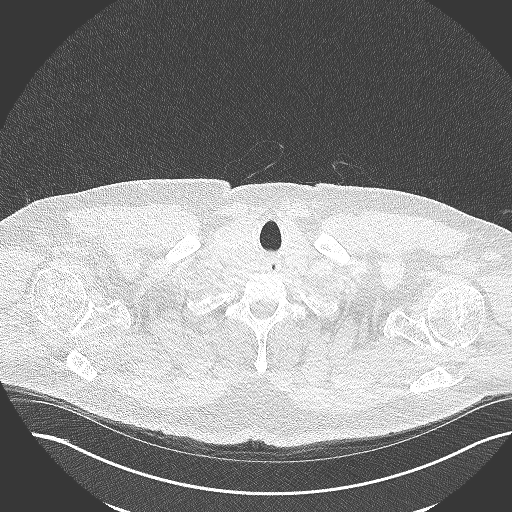

[Series 5: coronal · coronal · 0.64mm/px · 3 of 134 slices shown]
[im 27/134  lung]
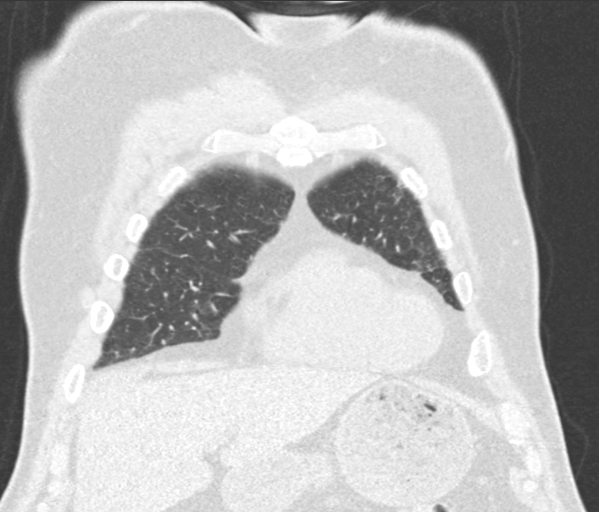
[im 54/134  lung]
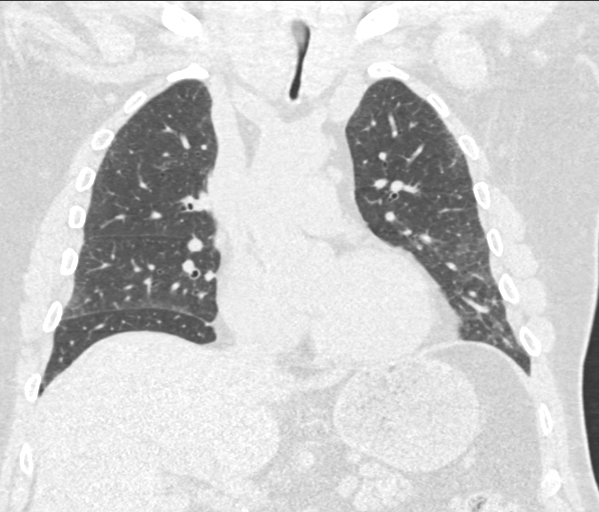
[im 80/134  lung]
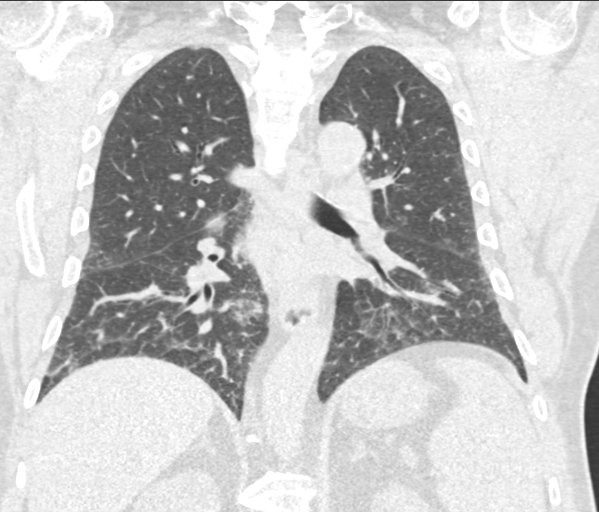

[14 of 36 positions shown; findings below may reference images not displayed]

FINDINGS: Cardiovascular: Heart size is normal. There is no significant
pericardial fluid, thickening or pericardial calcification. Aortic
atherosclerosis. No definite coronary artery calcifications.

Mediastinum/Nodes: No pathologically enlarged mediastinal or hilar
lymph nodes. Please note that accurate exclusion of hilar adenopathy
is limited on noncontrast CT scans. Thyroid gland is again
heterogeneous in appearance with asymmetric mass-like enlargement of
the right lobe of the gland which currently measures 4.4 x 3.5 cm,
similar to the prior study, likely to reflect an asymmetric goiter.
This again exerts severe mass effect upon the proximal trachea which
is severely narrowed and slit like in appearance (axial image 11 of
series 2). No axillary lymphadenopathy.

Lungs/Pleura: Multiple pulmonary nodules are again noted, several of
which are new. The largest of these are non solid in appearance,
measuring up to 20.2 mm in volume derived mean diameter in the right
lower lobe (axial image 214 of series 3). No acute consolidative
airspace disease. No pleural effusions. Diffuse bronchial wall
thickening with mild centrilobular and paraseptal emphysema. In
addition, there is worsening widespread patchy areas of ground-glass
attenuation and septal thickening which is most evident throughout
the mid to lower lungs bilaterally, concerning for progressive
interstitial lung disease.

Upper Abdomen: Unremarkable.

Musculoskeletal: There are no aggressive appearing lytic or blastic
lesions noted in the visualized portions of the skeleton.
IMPRESSION: 1. Lung-RADS 3S, probably benign findings. Short-term follow-up in 6
months is recommended with repeat low-dose chest CT without contrast
(please use the following order, "CT CHEST LCS NODULE FOLLOW-UP W/O
CM").
2. The "S" modifier above refers to potentially clinically
significant non lung cancer related findings. Specifically, there is
evidence of progressive interstitial lung disease which would be
categorized as indeterminate for usual interstitial pneumonia (UIP)
per current ATS guidelines at this time. Outpatient referral to
Pulmonology for further clinical evaluation is recommended.
Additionally, attention at time of repeat low-dose lung cancer
screening chest CT is recommended to assess for temporal changes in
the appearance of the lung parenchyma.
3. Persistent asymmetric mass-like enlargement of the right lobe of
the thyroid gland. This may simply represent an asymmetric goiter,
particularly in light of prior thyroid ultrasound 09/27/2018.
However, it should be noted that this causes significant compression
of the trachea which is very narrowed and slit like in appearance.
If the patient is symptomatic, surgical consultation should be
considered for resection.
4. Aortic atherosclerosis.
5. Mild diffuse bronchial wall thickening with mild centrilobular
and paraseptal emphysema; imaging findings suggestive of underlying
COPD.

Aortic Atherosclerosis (XV0C0-3E3.3) and Emphysema ([id number].Q).

## 2023-02-22 ENCOUNTER — Emergency Department (HOSPITAL_BASED_OUTPATIENT_CLINIC_OR_DEPARTMENT_OTHER)
Admission: EM | Admit: 2023-02-22 | Discharge: 2023-02-22 | Disposition: A | Discharge status: Home / Self Care | Payer: Managed Care, Other (non HMO) | Attending: Emergency Medicine | Admitting: Emergency Medicine

## 2023-02-22 ENCOUNTER — Other Ambulatory Visit (HOSPITAL_BASED_OUTPATIENT_CLINIC_OR_DEPARTMENT_OTHER): Payer: Self-pay

## 2023-02-22 ENCOUNTER — Encounter (HOSPITAL_BASED_OUTPATIENT_CLINIC_OR_DEPARTMENT_OTHER): Payer: Self-pay | Admitting: Emergency Medicine

## 2023-02-22 ENCOUNTER — Emergency Department (HOSPITAL_BASED_OUTPATIENT_CLINIC_OR_DEPARTMENT_OTHER): Payer: Managed Care, Other (non HMO)

## 2023-02-22 ENCOUNTER — Other Ambulatory Visit: Payer: Self-pay

## 2023-02-22 DIAGNOSIS — R1013 Epigastric pain: Secondary | ICD-10-CM | POA: Diagnosis present

## 2023-02-22 DIAGNOSIS — K8500 Idiopathic acute pancreatitis without necrosis or infection: Secondary | ICD-10-CM | POA: Insufficient documentation

## 2023-02-22 LAB — CBC WITH DIFFERENTIAL/PLATELET
Abs Immature Granulocytes: 0.04 10*3/uL (ref 0.00–0.07)
Basophils Absolute: 0.1 10*3/uL (ref 0.0–0.1)
Basophils Relative: 0 %
Eosinophils Absolute: 0.4 10*3/uL (ref 0.0–0.5)
Eosinophils Relative: 3 %
HCT: 45.7 % (ref 39.0–52.0)
Hemoglobin: 15.4 g/dL (ref 13.0–17.0)
Immature Granulocytes: 0 %
Lymphocytes Relative: 20 %
Lymphs Abs: 2.2 10*3/uL (ref 0.7–4.0)
MCH: 28.5 pg (ref 26.0–34.0)
MCHC: 33.7 g/dL (ref 30.0–36.0)
MCV: 84.5 fL (ref 80.0–100.0)
Monocytes Absolute: 0.9 10*3/uL (ref 0.1–1.0)
Monocytes Relative: 8 %
Neutro Abs: 7.9 10*3/uL — ABNORMAL HIGH (ref 1.7–7.7)
Neutrophils Relative %: 69 %
Platelets: 192 10*3/uL (ref 150–400)
RBC: 5.41 MIL/uL (ref 4.22–5.81)
RDW: 13.2 % (ref 11.5–15.5)
WBC: 11.5 10*3/uL — ABNORMAL HIGH (ref 4.0–10.5)
nRBC: 0 % (ref 0.0–0.2)

## 2023-02-22 LAB — LIPASE, BLOOD: Lipase: 84 U/L — ABNORMAL HIGH (ref 11–51)

## 2023-02-22 LAB — COMPREHENSIVE METABOLIC PANEL
ALT: 162 U/L — ABNORMAL HIGH (ref 0–44)
AST: 112 U/L — ABNORMAL HIGH (ref 15–41)
Albumin: 4.1 g/dL (ref 3.5–5.0)
Alkaline Phosphatase: 79 U/L (ref 38–126)
Anion gap: 10 (ref 5–15)
BUN: 19 mg/dL (ref 8–23)
CO2: 24 mmol/L (ref 22–32)
Calcium: 9 mg/dL (ref 8.9–10.3)
Chloride: 100 mmol/L (ref 98–111)
Creatinine, Ser: 1.12 mg/dL (ref 0.61–1.24)
GFR, Estimated: >60 mL/min (ref >60)
Glucose, Bld: 110 mg/dL — ABNORMAL HIGH (ref 70–99)
Potassium: 3.8 mmol/L (ref 3.5–5.1)
Sodium: 134 mmol/L — ABNORMAL LOW (ref 135–145)
Total Bilirubin: 0.9 mg/dL (ref <1.2)
Total Protein: 7.2 g/dL (ref 6.5–8.1)

## 2023-02-22 MED ORDER — ONDANSETRON 4 MG PO TBDP
4.0000 mg | ORAL_TABLET | ORAL | 0 refills | Status: DC | PRN
  Filled 2023-02-22: qty 20, 4d supply, fill #0

## 2023-02-22 MED ORDER — ONDANSETRON HCL 4 MG/2ML IJ SOLN
4.0000 mg | Freq: Once | INTRAMUSCULAR | Status: AC
  Administered 2023-02-22: 4 mg via INTRAVENOUS
  Filled 2023-02-22: qty 2

## 2023-02-22 MED ORDER — SODIUM CHLORIDE 0.9 % IV BOLUS
1000.0000 mL | Freq: Once | INTRAVENOUS | Status: AC
  Administered 2023-02-22: 1000 mL via INTRAVENOUS

## 2023-02-22 MED ORDER — IOHEXOL 300 MG/ML  SOLN
100.0000 mL | Freq: Once | INTRAMUSCULAR | Status: AC | PRN
  Administered 2023-02-22: 100 mL via INTRAVENOUS

## 2023-02-22 MED ORDER — MORPHINE SULFATE (PF) 4 MG/ML IV SOLN
4.0000 mg | Freq: Once | INTRAVENOUS | Status: DC
  Filled 2023-02-22: qty 1

## 2023-02-22 MED ORDER — MORPHINE SULFATE 15 MG PO TABS
7.5000 mg | ORAL_TABLET | ORAL | 0 refills | Status: DC | PRN
  Filled 2023-02-22: qty 7, 3d supply, fill #0

## 2023-02-22 NOTE — ED Triage Notes (Signed)
Pt reports LUQ pain and umbilicus x 4 days,  went to pcp for tx, had labs taken, reports being called for abnormal labs stating results indicate pancreatitis

## 2023-02-22 NOTE — Discharge Instructions (Signed)
Follow up with your gastroenterologist in the office.    Also take tylenol 1000mg (2 extra strength) four times a day.   Then take the pain medicine if you feel like you need it. Narcotics do not help with the pain, they only make you care about it less.  You can become addicted to this, people may break into your house to steal it.  It will constipate you.  If you drive under the influence of this medicine you can get a DUI.

## 2023-02-22 NOTE — ED Notes (Signed)
ED Provider at bedside. 

## 2023-02-22 NOTE — ED Provider Notes (Signed)
[place] EMERGENCY DEPARTMENT AT [facility] Provider Note   CSN: [id number] Arrival date & time: 02/22/23  1124     History  Chief Complaint  Patient presents with   Abdominal Pain    [name] is a 67 y.o. male.  18 y M with a chief complaint of abdominal pain.  Going on now for about 4 days.  Saw his family doctor yesterday and had blood work obtained.  He was called today and told he had pancreatitis and that he needed to come to the ED emergently to be evaluated.  He has been well enough to go to work.  Has been eating and drinking but has had some significant nausea with eating though denies vomiting.  Denies fevers.  Has not had a bowel movement since his symptoms started.   Abdominal Pain      Home Medications Prior to Admission medications   Medication Sig Start Date End Date Taking? Authorizing Provider  atorvastatin (LIPITOR) 20 MG tablet Take 1 tablet by mouth daily. 03/27/19  Yes [provider]  metFORMIN (GLUCOPHAGE-XR) 500 MG 24 hr tablet Take 500 mg by mouth every evening. 02/04/23  Yes [provider]  morphine (MSIR) 15 MG tablet Take 0.5 tablets (7.5 mg total) by mouth every 4 (four) hours as needed for severe pain (pain score 7-10). 02/22/23  Yes Melene Plan, DO  ondansetron (ZOFRAN-ODT) 4 MG disintegrating tablet 4mg  ODT q4 hours prn nausea/vomit 02/22/23  Yes Melene Plan, DO  atorvastatin (LIPITOR) 20 MG tablet Take 20 mg by mouth daily. 06/30/18   [provider]  betamethasone dipropionate 0.05 % cream Apply topically daily. 04/02/22   [provider]      Allergies    Penicillins    Review of Systems   Review of Systems  Gastrointestinal:  Positive for abdominal pain.    Physical Exam Updated Vital Signs BP 125/89   Pulse 65   Temp 98.9 F (37.2 C) (Oral)   Resp 20   SpO2 98%  Physical Exam Vitals and nursing note reviewed.  Constitutional:      Appearance: He is well-developed.  HENT:      Head: Normocephalic and atraumatic.  Eyes:     Pupils: Pupils are equal, round, and reactive to light.  Neck:     Vascular: No JVD.  Cardiovascular:     Rate and Rhythm: Normal rate and regular rhythm.     Heart sounds: No murmur heard.    No friction rub. No gallop.  Pulmonary:     Effort: No respiratory distress.     Breath sounds: No wheezing.  Abdominal:     General: There is no distension.     Tenderness: There is no abdominal tenderness. There is no guarding or rebound.     Comments: Some very mild epigastric discomfort.  Musculoskeletal:        General: Normal range of motion.     Cervical back: Normal range of motion and neck supple.  Skin:    Coloration: Skin is not pale.     Findings: No rash.  Neurological:     Mental Status: He is alert and oriented to person, place, and time.  Psychiatric:        Behavior: Behavior normal.     ED Results / Procedures / Treatments   Labs (all labs ordered are listed, but only abnormal results are displayed) Labs Reviewed  CBC WITH DIFFERENTIAL/PLATELET - Abnormal; Notable for the following components:  Result Value   WBC 11.5 (*)    Neutro Abs 7.9 (*)    All other components within normal limits  COMPREHENSIVE METABOLIC PANEL - Abnormal; Notable for the following components:   Sodium 134 (*)    Glucose, Bld 110 (*)    AST 112 (*)    ALT 162 (*)    All other components within normal limits  LIPASE, BLOOD - Abnormal; Notable for the following components:   Lipase 84 (*)    All other components within normal limits    EKG None  Radiology CT ABDOMEN PELVIS W CONTRAST  Result Date: 02/22/2023 CLINICAL DATA:  Left upper quadrant and paraumbilical abdominal pain for 4 days. Elevated pancreatic enzymes. EXAM: CT ABDOMEN AND PELVIS WITH CONTRAST TECHNIQUE: Multidetector CT imaging of the abdomen and pelvis was performed using the standard protocol following bolus administration of intravenous contrast. RADIATION  DOSE REDUCTION: This exam was performed according to the departmental dose-optimization program which includes automated exposure control, adjustment of the mA and/or kV according to patient size and/or use of iterative reconstruction technique. CONTRAST:  OMNIPAQUE IOHEXOL 300 MG/ML  SOLN COMPARISON:  None Available. FINDINGS: Lower Chest: Chronic interstitial lung disease in both lung bases. Hepatobiliary: Probable tiny sub-centimeter cyst seen in medial right hepatic lobe. No suspicious hepatic masses identified. Gallbladder is unremarkable. No evidence of biliary ductal dilatation. Pancreas: Mild diffuse pancreatic edema and peripancreatic soft tissue stranding is consistent with a acute pancreatitis. No No evidence of pancreatic necrosis or pseudocyst. No No evidence of pancreatic mass or ductal dilatation. Spleen: Within normal limits in size and appearance. Adrenals/Urinary Tract: No suspicious masses identified. No evidence of ureteral calculi or hydronephrosis. Stomach/Bowel: No evidence of obstruction, inflammatory process or abnormal fluid collections. Normal appendix visualized. Vascular/Lymphatic: No pathologically enlarged lymph nodes. No acute vascular findings. Reproductive:  No mass or other significant abnormality. Other:  None. Musculoskeletal:  No suspicious bone lesions identified. IMPRESSION: Mild acute pancreatitis. No evidence of pancreatic necrosis, pseudocyst, or other complication. Electronically Signed   By: Danae Orleans M.D.   On: 02/22/2023 14:52    Procedures Procedures    Medications Ordered in ED Medications  morphine (PF) 4 MG/ML injection 4 mg (0 mg Intravenous Hold 02/22/23 1158)  sodium chloride 0.9 % bolus 1,000 mL (0 mLs Intravenous Stopped 02/22/23 1415)  ondansetron (ZOFRAN) injection 4 mg (4 mg Intravenous Given 02/22/23 1156)  iohexol (OMNIPAQUE) 300 MG/ML solution 100 mL (100 mLs Intravenous Contrast Given 02/22/23 1258)    ED Course/ Medical Decision  Making/ A&P                                 Medical Decision Making Amount and/or Complexity of Data Reviewed Labs: ordered. Radiology: ordered.  Risk Prescription drug management.   67 yo M with a chief complaint of abdominal pain.  This is been going on for about 4 days.  He is well-appearing and nontoxic.  Has a largely benign exam.  He was seen by his doctor yesterday and told he had pancreatitis and told he needed to come to the ED emergently to be evaluated.  Will obtain a laboratory evaluation treat pain and nausea CT imaging.  Mild leukocytosis, LFTs are trivially elevated.  Lipase is also in the indiscriminate range.  CT imaging is consistent with pancreatitis.  I discussed results with the patient.  He is doing well and has been able to eat and drink.  Feel he should be reasonably safe for discharge.  It sounds like his symptoms also have improved over the past 4 days.  He denies drinking alcohol.  Has no obvious biliary disease on CT.  He has had his cholesterol checked is not on a statin I think it is unlikely to be hypertriglyceridemia.  Will have him follow-up with his gastroenterologist in the office.  3:09 PM:  I have discussed the diagnosis/risks/treatment options with the patient.  Evaluation and diagnostic testing in the emergency department does not suggest an emergent condition requiring admission or immediate intervention beyond what has been performed at this time.  They will follow up with GI, PCP. We also discussed returning to the ED immediately if new or worsening sx occur. We discussed the sx which are most concerning (e.g., sudden worsening pain, fever, inability to tolerate by mouth) that necessitate immediate return. Medications administered to the patient during their visit and any new prescriptions provided to the patient are listed below.  Medications given during this visit Medications  morphine (PF) 4 MG/ML injection 4 mg (0 mg Intravenous Hold 02/22/23  1158)  sodium chloride 0.9 % bolus 1,000 mL (0 mLs Intravenous Stopped 02/22/23 1415)  ondansetron (ZOFRAN) injection 4 mg (4 mg Intravenous Given 02/22/23 1156)  iohexol (OMNIPAQUE) 300 MG/ML solution 100 mL (100 mLs Intravenous Contrast Given 02/22/23 1258)     The patient appears reasonably screen and/or stabilized for discharge and I doubt any other medical condition or other [facility] requiring further screening, evaluation, or treatment in the ED at this time prior to discharge.          Final Clinical Impression(s) / ED Diagnoses Final diagnoses:  Idiopathic acute pancreatitis, unspecified complication status    Rx / DC Orders ED Discharge Orders          Ordered    morphine (MSIR) 15 MG tablet  Every 4 hours PRN        02/22/23 1507    ondansetron (ZOFRAN-ODT) 4 MG disintegrating tablet        02/22/23 1507              Melene Plan, DO 02/22/23 1509

## 2023-02-28 ENCOUNTER — Encounter: Payer: Self-pay | Admitting: Nurse Practitioner

## 2023-02-28 ENCOUNTER — Ambulatory Visit: Payer: Managed Care, Other (non HMO) | Admitting: Nurse Practitioner

## 2023-02-28 ENCOUNTER — Telehealth: Payer: Managed Care, Other (non HMO) | Admitting: Nurse Practitioner

## 2023-02-28 ENCOUNTER — Ambulatory Visit (INDEPENDENT_AMBULATORY_CARE_PROVIDER_SITE_OTHER): Payer: Managed Care, Other (non HMO) | Admitting: Pulmonary Disease

## 2023-02-28 DIAGNOSIS — J849 Interstitial pulmonary disease, unspecified: Secondary | ICD-10-CM

## 2023-02-28 DIAGNOSIS — F17210 Nicotine dependence, cigarettes, uncomplicated: Secondary | ICD-10-CM

## 2023-02-28 LAB — PULMONARY FUNCTION TEST
DL/VA % pred: 94 %
DL/VA: 3.82 ml/min/mmHg/L
DLCO cor % pred: 77 %
DLCO cor: 21.42 ml/min/mmHg
DLCO unc % pred: 78 %
DLCO unc: 21.89 ml/min/mmHg
FEF 25-75 Post: 4.14 L/s
FEF 25-75 Pre: 4.2 L/s
FEF2575-%Change-Post: -1 %
FEF2575-%Pred-Post: 152 %
FEF2575-%Pred-Pre: 154 %
FEV1-%Change-Post: 0 %
FEV1-%Pred-Post: 89 %
FEV1-%Pred-Pre: 89 %
FEV1-Post: 3.19 L
FEV1-Pre: 3.17 L
FEV1FVC-%Change-Post: 4 %
FEV1FVC-%Pred-Pre: 116 %
FEV6-%Change-Post: -3 %
FEV6-%Pred-Post: 78 %
FEV6-%Pred-Pre: 80 %
FEV6-Post: 3.56 L
FEV6-Pre: 3.68 L
FEV6FVC-%Pred-Post: 105 %
FEV6FVC-%Pred-Pre: 105 %
FVC-%Change-Post: -3 %
FVC-%Pred-Post: 74 %
FVC-%Pred-Pre: 76 %
FVC-Post: 3.56 L
FVC-Pre: 3.68 L
Post FEV1/FVC ratio: 90 %
Post FEV6/FVC ratio: 100 %
Pre FEV1/FVC ratio: 86 %
Pre FEV6/FVC Ratio: 100 %
RV % pred: 56 %
RV: 1.44 L
TLC % pred: 71 %
TLC: 5.33 L

## 2023-02-28 NOTE — Patient Instructions (Signed)
Full PFT Performed Today  

## 2023-02-28 NOTE — Progress Notes (Unsigned)
Patient ID: [name], male     DOB: [birth year], 67 y.o.      MRN: [id number]  No chief complaint on file.   Virtual Visit via Video Note  I connected with [name] on 03/02/23 at  4:00 PM EST by a video enabled telemedicine application and verified that I am speaking with the correct person using two identifiers.  Location: Patient: Home Provider: Office   I discussed the limitations of evaluation and management by telemedicine and the availability of in person appointments. The patient expressed understanding and agreed to proceed.  History of Present Illness: 67 year old male, active smoker followed for ILD. He is a patient of [name] and last seen in office 04/23/2022. Past medical history significant for multinodular goiter, HLD, DM, psoriasis.   TESTS/EVENTS: 04/16/2021 PFT: FVC 74, FEV1 84, ratio 87, TLC 76, DLCOcor 82.  No significant bronchodilator response.  Moderate restrictive lung disease with normal diffusing capacity 04/13/2022 HRCT chest: artery calcifications. Prior right thyroidectomy. Calcified mediastinal and hilar lymph nodes. Mild centrilobular emphysema. Mild air trapping. Calcified granuloma of LUL. Peribronchovascular and lower lung predominate reticular and ground-glass opacities. Franklin focal area of nodular consolidation RLL, 8 mm and unchanged. Likely focal fibrosis. Small scattered nodules. NSIP vs fibrotic HP 02/28/2023 PFT: FVC 76, FEV1 89, ratio 90, TLC 71, DLCOcor 77. Moderate restriction with mildly moderate diffusion defect   04/23/2022: OV with [name].  Initially evaluated for chronic cough of many years and DOE.  He also had abnormal CT of the chest.  CT showed nonspecific groundglass changes at the bases.  No signs or symptoms of connective tissue disease and CTD serologies are negative.  Does have some chemical exposure in his line of work but does not appear to be significant.  Possible RB ILD due to ongoing smoking.  Smoking cessation  discussed.  Continue to monitor closely.  Discuss further workup such as surgical lung biopsy or bronchoscopy with BAL and transbronchial biopsies but wants to avoid invasive procedures.  Continue conservative management.  Not interested in quitting smoking at this time.  Repeat PFTs in 6 months.  Breathing stable.  02/28/2023: Today - follow up Patient presents today for follow up after PFT. These were relatively stable aside from slight decline in DLCO by 5%. His breathing has been stable. Job is relatively strenuous but he doesn't have any trouble. He does have a daily cough, which is sometimes productive and sometimes not. Unchanged from baseline. No issues with congestion, wheezing, leg swelling, orthopnea. Does not have to use any inhalers. Still smoking.   Allergies  Allergen Reactions   Penicillins Other (See Comments)    Per Patient, had a childhood reaction.    Immunization History  Administered Date(s) Administered   Influenza, High Dose Seasonal PF 01/21/2021   Janssen (J&J) SARS-COV-2 Vaccination 07/27/2019   Pneumococcal Polysaccharide-23 03/27/2021   Past Medical History:  Diagnosis Date   Diabetes mellitus without complication (HCC)    Hyperlipidemia     Tobacco History: Social History   Tobacco Use  Smoking Status Every Day   Current packs/day: 0.50   Average packs/day: 0.5 packs/day for 52.4 years (26.2 ttl pk-yrs)   Types: Cigarettes   Start date: 10/22/1970  Smokeless Tobacco Former   Types: Snuff   Quit date: 03/26/2021   Ready to quit: Not Answered Counseling given: Not Answered   Outpatient Medications Prior to Visit  Medication Sig Dispense Refill   atorvastatin (LIPITOR) 20 MG tablet Take  20 mg by mouth daily.     atorvastatin (LIPITOR) 20 MG tablet Take 1 tablet by mouth daily.     betamethasone dipropionate 0.05 % cream Apply topically daily.     metFORMIN (GLUCOPHAGE-XR) 500 MG 24 hr tablet Take 500 mg by mouth every evening.     morphine (MSIR)  15 MG tablet Take 0.5 tablets (7.5 mg total) by mouth every 4 (four) hours as needed for severe pain (pain score 7-10). 7 tablet 0   ondansetron (ZOFRAN-ODT) 4 MG disintegrating tablet Take 1 tablet (4 mg total) by mouth every 4 (four) hours as needed for nausea/vomiting 20 tablet 0   No facility-administered medications prior to visit.     Review of Systems:   Constitutional: No weight loss or gain, night sweats, fevers, chills, fatigue, or lassitude. HEENT: No headaches, difficulty swallowing, tooth/dental problems, or sore throat. No sneezing, itching, ear ache, nasal congestion, or post nasal drip CV:  No chest pain, orthopnea, PND, swelling in lower extremities, anasarca, dizziness, palpitations, syncope Resp: +minimal shortness of breath with exertion; chronic cough. No excess mucus or change in color of mucus. No hemoptysis. No wheezing.  No chest wall deformity GI:  No heartburn, indigestion GU: No dysuria, change in color of urine, urgency or frequency.   Skin: No rash, lesions, ulcerations MSK:  No joint pain or swelling.   Neuro: No dizziness or lightheadedness.  Psych: No depression or anxiety. Mood stable.   Observations/Objective: Patient is well-developed, well-nourished in no acute distress. A&Ox3. Resting comfortably at home. Unlabored breathing.  Speech is clear and coherent with logical content.    Assessment and Plan: ILD (interstitial lung disease) (HCC) ILD. Unclear cause. May be RB ILD due to ongoing smoking. Prefers to not undergo any invasive procedures. Smoking cessation advised. PFT stable aside from slight decline in DLCO. Will repeat in 6 months. No worsening symptoms. Continue conservative management.   Patient Instructions  Repeat pulmonary function testing in 6 months  Work on quitting smoking   Repeat HRCT chest in January 2025 for one year follow up  Follow up in 6 months after PFT with [name]. If symptoms worsen, please contact office for  sooner follow up or seek emergency care.    Cigarette smoker No desire to quit right now. Smoking cessation advised.     I discussed the assessment and treatment plan with the patient. The patient was provided an opportunity to ask questions and all were answered. The patient agreed with the plan and demonstrated an understanding of the instructions.   The patient was advised to call back or seek an in-person evaluation if the symptoms worsen or if the condition fails to improve as anticipated.  I provided 22 minutes of non-face-to-face time during this encounter.   Noemi Chapel, NP

## 2023-02-28 NOTE — Progress Notes (Signed)
Full PFT Performed Today  

## 2023-03-02 ENCOUNTER — Encounter: Payer: Self-pay | Admitting: Nurse Practitioner

## 2023-03-02 DIAGNOSIS — J849 Interstitial pulmonary disease, unspecified: Secondary | ICD-10-CM | POA: Insufficient documentation

## 2023-03-02 DIAGNOSIS — F17210 Nicotine dependence, cigarettes, uncomplicated: Secondary | ICD-10-CM | POA: Insufficient documentation

## 2023-03-02 NOTE — Assessment & Plan Note (Signed)
ILD. Unclear cause. May be RB ILD due to ongoing smoking. Prefers to not undergo any invasive procedures. Smoking cessation advised. PFT stable aside from slight decline in DLCO. Will repeat in 6 months. No worsening symptoms. Continue conservative management.   Patient Instructions  Repeat pulmonary function testing in 6 months  Work on quitting smoking   Repeat HRCT chest in January 2025 for one year follow up  Follow up in 6 months after PFT with Dr. Isaiah Serge. If symptoms worsen, please contact office for sooner follow up or seek emergency care.

## 2023-03-02 NOTE — Assessment & Plan Note (Signed)
No desire to quit right now. Smoking cessation advised.

## 2023-03-02 NOTE — Patient Instructions (Addendum)
Repeat pulmonary function testing in 6 months  Work on quitting smoking   Repeat HRCT chest in January 2025 for one year follow up  Follow up in 6 months after PFT with Dr. Isaiah Serge. If symptoms worsen, please contact office for sooner follow up or seek emergency care.

## 2023-03-17 ENCOUNTER — Ambulatory Visit
Admission: RE | Admit: 2023-03-17 | Discharge: 2023-03-17 | Disposition: A | Discharge status: Home / Self Care | Payer: Managed Care, Other (non HMO) | Source: Ambulatory Visit | Attending: Nurse Practitioner | Admitting: Nurse Practitioner

## 2023-03-17 DIAGNOSIS — J849 Interstitial pulmonary disease, unspecified: Secondary | ICD-10-CM

## 2023-11-03 ENCOUNTER — Other Ambulatory Visit: Payer: Self-pay

## 2023-11-03 DIAGNOSIS — J849 Interstitial pulmonary disease, unspecified: Secondary | ICD-10-CM

## 2023-11-10 ENCOUNTER — Encounter

## 2023-11-10 ENCOUNTER — Ambulatory Visit: Admitting: Pulmonary Disease

## 2024-01-18 ENCOUNTER — Ambulatory Visit: Admitting: Pulmonary Disease

## 2024-01-18 ENCOUNTER — Encounter: Payer: Self-pay | Admitting: Pulmonary Disease

## 2024-01-18 ENCOUNTER — Encounter

## 2024-01-24 ENCOUNTER — Ambulatory Visit (HOSPITAL_BASED_OUTPATIENT_CLINIC_OR_DEPARTMENT_OTHER)

## 2024-01-24 DIAGNOSIS — J849 Interstitial pulmonary disease, unspecified: Secondary | ICD-10-CM | POA: Diagnosis not present

## 2024-01-24 LAB — PULMONARY FUNCTION TEST
DL/VA % pred: 98 %
DL/VA: 3.94 ml/min/mmHg/L
DLCO unc % pred: 74 %
DLCO unc: 20.41 ml/min/mmHg
FEF 25-75 Post: 3.65 L/s
FEF 25-75 Pre: 3.93 L/s
FEF2575-%Change-Post: -7 %
FEF2575-%Pred-Post: 137 %
FEF2575-%Pred-Pre: 147 %
FEV1-%Change-Post: 2 %
FEV1-%Pred-Post: 86 %
FEV1-%Pred-Pre: 84 %
FEV1-Post: 3.03 L
FEV1-Pre: 2.96 L
FEV1FVC-%Change-Post: 1 %
FEV1FVC-%Pred-Pre: 116 %
FEV6-%Change-Post: 0 %
FEV6-%Pred-Post: 77 %
FEV6-%Pred-Pre: 76 %
FEV6-Post: 3.48 L
FEV6-Pre: 3.46 L
FEV6FVC-%Pred-Post: 105 %
FEV6FVC-%Pred-Pre: 105 %
FVC-%Change-Post: 0 %
FVC-%Pred-Post: 73 %
FVC-%Pred-Pre: 72 %
FVC-Post: 3.48 L
FVC-Pre: 3.46 L
Post FEV1/FVC ratio: 87 %
Post FEV6/FVC ratio: 100 %
Pre FEV1/FVC ratio: 85 %
Pre FEV6/FVC Ratio: 100 %
RV % pred: 113 %
RV: 2.9 L
TLC % pred: 86 %
TLC: 6.4 L

## 2024-01-24 NOTE — Progress Notes (Signed)
 Full PFT performed today.

## 2024-01-24 NOTE — Patient Instructions (Signed)
 Full PFT performed today.

## 2024-02-02 ENCOUNTER — Ambulatory Visit: Payer: Self-pay | Admitting: Pulmonary Disease

## 2024-02-13 ENCOUNTER — Observation Stay (HOSPITAL_BASED_OUTPATIENT_CLINIC_OR_DEPARTMENT_OTHER): Admission: EM | Admit: 2024-02-13 | Discharge: 2024-02-16 | DRG: 418 | Disposition: A | Source: Ambulatory Visit

## 2024-02-13 ENCOUNTER — Observation Stay (HOSPITAL_COMMUNITY)

## 2024-02-13 ENCOUNTER — Encounter (HOSPITAL_BASED_OUTPATIENT_CLINIC_OR_DEPARTMENT_OTHER): Payer: Self-pay | Admitting: Emergency Medicine

## 2024-02-13 ENCOUNTER — Other Ambulatory Visit: Payer: Self-pay

## 2024-02-13 ENCOUNTER — Encounter: Payer: Self-pay | Admitting: Pulmonary Disease

## 2024-02-13 ENCOUNTER — Emergency Department (HOSPITAL_BASED_OUTPATIENT_CLINIC_OR_DEPARTMENT_OTHER)

## 2024-02-13 ENCOUNTER — Ambulatory Visit: Admitting: Pulmonary Disease

## 2024-02-13 VITALS — BP 150/95 | HR 66 | Temp 97.6°F | Ht 72.0 in | Wt 274.0 lb

## 2024-02-13 DIAGNOSIS — E785 Hyperlipidemia, unspecified: Secondary | ICD-10-CM | POA: Diagnosis not present

## 2024-02-13 DIAGNOSIS — E1169 Type 2 diabetes mellitus with other specified complication: Secondary | ICD-10-CM | POA: Diagnosis not present

## 2024-02-13 DIAGNOSIS — Z8719 Personal history of other diseases of the digestive system: Secondary | ICD-10-CM | POA: Diagnosis not present

## 2024-02-13 DIAGNOSIS — E119 Type 2 diabetes mellitus without complications: Secondary | ICD-10-CM

## 2024-02-13 DIAGNOSIS — K859 Acute pancreatitis without necrosis or infection, unspecified: Secondary | ICD-10-CM | POA: Diagnosis not present

## 2024-02-13 DIAGNOSIS — J849 Interstitial pulmonary disease, unspecified: Secondary | ICD-10-CM

## 2024-02-13 DIAGNOSIS — E669 Obesity, unspecified: Secondary | ICD-10-CM | POA: Diagnosis not present

## 2024-02-13 DIAGNOSIS — F17210 Nicotine dependence, cigarettes, uncomplicated: Secondary | ICD-10-CM | POA: Diagnosis not present

## 2024-02-13 DIAGNOSIS — R1013 Epigastric pain: Secondary | ICD-10-CM | POA: Diagnosis not present

## 2024-02-13 LAB — URINALYSIS, ROUTINE W REFLEX MICROSCOPIC
Bilirubin Urine: NEGATIVE
Glucose, UA: NEGATIVE mg/dL
Hgb urine dipstick: NEGATIVE
Ketones, ur: NEGATIVE mg/dL
Leukocytes,Ua: NEGATIVE
Nitrite: NEGATIVE
Protein, ur: NEGATIVE mg/dL
Specific Gravity, Urine: 1.017 (ref 1.005–1.030)
pH: 5.5 (ref 5.0–8.0)

## 2024-02-13 LAB — COMPREHENSIVE METABOLIC PANEL WITH GFR
ALT: 45 U/L — ABNORMAL HIGH (ref 0–44)
AST: 27 U/L (ref 15–41)
Albumin: 4.4 g/dL (ref 3.5–5.0)
Alkaline Phosphatase: 94 U/L (ref 38–126)
Anion gap: 12 (ref 5–15)
BUN: 15 mg/dL (ref 8–23)
CO2: 25 mmol/L (ref 22–32)
Calcium: 10.3 mg/dL (ref 8.9–10.3)
Chloride: 99 mmol/L (ref 98–111)
Creatinine, Ser: 0.99 mg/dL (ref 0.61–1.24)
GFR, Estimated: >60 mL/min (ref >60)
Glucose, Bld: 127 mg/dL — ABNORMAL HIGH (ref 70–99)
Potassium: 4.6 mmol/L (ref 3.5–5.1)
Sodium: 136 mmol/L (ref 135–145)
Total Bilirubin: 1 mg/dL (ref 0.0–1.2)
Total Protein: 7.9 g/dL (ref 6.5–8.1)

## 2024-02-13 LAB — CBC
HCT: 49.6 % (ref 39.0–52.0)
Hemoglobin: 16.6 g/dL (ref 13.0–17.0)
MCH: 28.5 pg (ref 26.0–34.0)
MCHC: 33.5 g/dL (ref 30.0–36.0)
MCV: 85.1 fL (ref 80.0–100.0)
Platelets: 187 K/uL (ref 150–400)
RBC: 5.83 MIL/uL — ABNORMAL HIGH (ref 4.22–5.81)
RDW: 13.4 % (ref 11.5–15.5)
WBC: 11.2 K/uL — ABNORMAL HIGH (ref 4.0–10.5)
nRBC: 0 % (ref 0.0–0.2)

## 2024-02-13 LAB — TRIGLYCERIDES: Triglycerides: 112 mg/dL (ref <150)

## 2024-02-13 LAB — LIPASE, BLOOD: Lipase: 790 U/L — ABNORMAL HIGH (ref 11–51)

## 2024-02-13 LAB — HIV ANTIBODY (ROUTINE TESTING W REFLEX): HIV Screen 4th Generation wRfx: NONREACTIVE

## 2024-02-13 LAB — ETHANOL: Alcohol, Ethyl (B): <15 mg/dL (ref <15)

## 2024-02-13 MED ORDER — MORPHINE SULFATE (PF) 4 MG/ML IV SOLN
4.0000 mg | Freq: Once | INTRAVENOUS | Status: AC
  Administered 2024-02-13: 4 mg via INTRAVENOUS
  Filled 2024-02-13: qty 1

## 2024-02-13 MED ORDER — FENTANYL CITRATE (PF) 50 MCG/ML IJ SOSY
50.0000 ug | PREFILLED_SYRINGE | Freq: Once | INTRAMUSCULAR | Status: AC
  Administered 2024-02-13: 50 ug via INTRAVENOUS
  Filled 2024-02-13: qty 1

## 2024-02-13 MED ORDER — IOHEXOL 300 MG/ML  SOLN
100.0000 mL | Freq: Once | INTRAMUSCULAR | Status: AC | PRN
  Administered 2024-02-13: 100 mL via INTRAVENOUS

## 2024-02-13 MED ORDER — ONDANSETRON HCL 4 MG/2ML IJ SOLN
4.0000 mg | Freq: Once | INTRAMUSCULAR | Status: AC
  Administered 2024-02-13: 4 mg via INTRAVENOUS
  Filled 2024-02-13: qty 2

## 2024-02-13 MED ORDER — ONDANSETRON HCL 4 MG PO TABS: 4.0000 mg | ORAL_TABLET | Freq: Four times a day (QID) | ORAL | Status: DC | PRN

## 2024-02-13 MED ORDER — SODIUM CHLORIDE 0.9 % IV SOLN
INTRAVENOUS | Status: AC
  Administered 2024-02-13: 100 mL/h via INTRAVENOUS

## 2024-02-13 MED ORDER — SODIUM CHLORIDE 0.9 % IV BOLUS
1000.0000 mL | Freq: Once | INTRAVENOUS | Status: AC
  Administered 2024-02-13: 1000 mL via INTRAVENOUS

## 2024-02-13 MED ORDER — MORPHINE SULFATE (PF) 4 MG/ML IV SOLN: 4.0000 mg | INTRAVENOUS | Status: DC | PRN

## 2024-02-13 MED ORDER — ONDANSETRON HCL 4 MG/2ML IJ SOLN: 4.0000 mg | Freq: Four times a day (QID) | INTRAMUSCULAR | Status: DC | PRN

## 2024-02-13 MED ORDER — HYDROMORPHONE HCL 1 MG/ML IJ SOLN: 1.0000 mg | INTRAMUSCULAR | Status: DC | PRN

## 2024-02-13 MED ORDER — ENOXAPARIN SODIUM 40 MG/0.4ML IJ SOSY
40.0000 mg | PREFILLED_SYRINGE | INTRAMUSCULAR | Status: DC
  Administered 2024-02-13 – 2024-02-15 (×2): 40 mg via SUBCUTANEOUS
  Filled 2024-02-13 (×3): qty 0.4

## 2024-02-13 NOTE — Assessment & Plan Note (Signed)
 Stable from a respiratory standpoint Continue inhalers

## 2024-02-13 NOTE — Assessment & Plan Note (Addendum)
 Progressive generalized abdominal pain for  with noted lipase in 700s as well as CT scan concerning for pancreatitis Similar presentation 02/2023 with outpatient GI follow up and fairly stable workup.  Triglyceride and ETOH level pending  Noted biliary sludge and mild GB distension on CT scan  Will check RUQ u/s to correlate  LFTs WNL  Pain control  IVF hydration  ADAT  Otherwise follow closely

## 2024-02-13 NOTE — Progress Notes (Signed)
 [name]    [id number]    [birth year]  Primary Care Physician:[facility]  Referring Physician: [facility] [address] [place],  [place] [address]  Chief complaint: Follow-up for abnormal CT, evaluation for ILD  HPI: 68 y.o. with history of multinodular goiter, hyperlipidemia, diabetes, psoriasis Referred for evaluation of abnormal screening CT of the chest with concern for interstitial lung disease  Complains of chronic cough for many years which is nonproductive.  He has dyspnea on exertion He is an active smoker.  Recently evaluated by Dr. Karis and Dr.Waltonen, ENT at [facility] for multinodular goiter with tracheal compression and mild inspiratory stridor.   He underwent thyroidectomy at [facility] on 03/26/2021.  The surgery went well with no issues.  His stridor is improved  He has history of psoriasis for which she takes over-the-counter steroid cream  Interim history: Discussed the use of AI scribe software for clinical note transcription with the patient, who gave verbal consent to proceed.  History of Present Illness [name] is a 68 year old male who presents with abdominal pain.  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  No mold, hot tub, Jacuzzi.  No feather pillows or comforter ILD questionnaire 02/26/2021-negative Smoking history: 30-pack-year smoker.  Continues to smoke half pack per  day Travel history: No significant travel history Relevant family history: No family history of lung disease  Outpatient Encounter Medications as of 02/13/2024  Medication Sig   atorvastatin (LIPITOR) 20 MG tablet Take 1 tablet by mouth daily.   metFORMIN (GLUCOPHAGE-XR) 500 MG 24 hr tablet Take 500 mg by mouth every evening.   atorvastatin (LIPITOR) 20 MG tablet Take 20 mg by mouth daily.   betamethasone dipropionate 0.05 % cream Apply topically daily. (Patient not taking: Reported on 02/13/2024)   morphine  (MSIR) 15 MG tablet Take 0.5 tablets (7.5 mg total) by mouth every 4 (four) hours as needed for severe pain (pain score 7-10).   ondansetron  (ZOFRAN -ODT) 4 MG disintegrating tablet Take 1 tablet (4 mg total) by mouth every 4 (four) hours as needed for nausea/vomiting (Patient not taking: Reported on 02/13/2024)   No facility-administered encounter medications on file as of 02/13/2024.   Vitals:   02/13/24 0911  BP: (!) 150/95  Pulse: 66  Temp: 97.6 F (36.4 C)  Height: 6' (1.829 m)  Weight: 274 lb (124.3 kg)  SpO2: 98%  TempSrc: Oral  BMI (Calculated): 37.15    Physical Exam GEN: No acute distress. CV: Regular rate and rhythm, no murmurs. LUNGS: Clear to auscultation bilaterally, normal respiratory effort. SKIN JOINTS: Warm and dry, no rash. ABDOMEN: Epigastric tenderness.   Data Reviewed: Imaging: CT low-dose 10/23/2020-multiple pulmonary nodules some of which are new.  Mild centrilobular and paraseptal emphysema.  Bilateral patchy groundglass attenuation and septal thickening in the mid to lower lungs which is worsened compared opacities.   High-resolution CT 03/17/2021-peripheral basilar predominant groundglass with subpleural reticulation,  no honeycombing.  Indeterminate for UIP.  May be RB ILD versus NSIP.  High-resolution CT 04/13/2022-stable interstitial lung disease in alternate pattern.  No progression compared to 2023.  High resolution CT 03/17/2023-stable pattern of  interstitial lung disease compared to 2023.  Alternate pattern.  PFTs: 04/16/2021 FVC 3.77 [74%], FEV1 3.29 [84%], F/F 85, TLC 5.81 [76%], DLCO 24.93 [86%]  01/24/2024 FVC 3.48 [73%], FEV1 3.03 [86%], F/F87, TLC 6.40 [86%], DLCO 20.41 [94%] Normal test  Labs: CTD serologies 02/26/2021-negative  Assessment & Plan Epigastric abdominal pain with history of acute pancreatitis Acute onset of severe epigastric abdominal pain, rated 8/10, similar to previous episode of acute pancreatitis. No alcohol use or gallstones. Differential includes pancreatitis and potential cardiac issues due to pain location and smoking history. - Advised immediate evaluation at the emergency department for abdominal pain and potential cardiac issues. - Recommended contacting the emergency room at [facility] to expedite care.  Follow-up for interstitial lung disease I have reviewed his CT with nonspecific groundglass changes at the base.  He does not have signs and symptoms of connective tissue disease and CTD serologies are negative.  He does have some chemical exposure in his line of work but does not appear to be significant.    This may be RB ILD due to ongoing smoking.  Smoking cessation discussed.  We will continue to monitor closely  We also discussed further workup such as surgical lung biopsy or bronchoscopy with BAL and transbronchial biopsies but he wants to avoid invasive procedures and continue conservative management.  Lung function tests are stable.  - Will schedule CT scan for January to monitor lung condition.  Nicotine dependence, current smoker Continues to smoke with no significant reduction in smoking habits. Lung function tests are stable. Expresses some interest in quitting smoking, especially due to current pain.  Time spent counseling-5 minutes - Encouraged smoking cessation.   I personally spent a total of 30 minutes in the care of the patient today including preparing to see the patient,  getting/reviewing separately obtained history, performing a medically appropriate exam/evaluation, counseling and educating, independently interpreting results, communicating results, and coordinating care.   Plan/Recommendations: ED evaluation for epigastric pain Follow-up high-res CT Smoking cessation  Myosha Cuadras MD [facility] 02/13/2024, 9:20 AM  CC: [facility]

## 2024-02-13 NOTE — Assessment & Plan Note (Signed)
 Blood sugar in 120s  SSI  A1C  Monitor

## 2024-02-13 NOTE — Assessment & Plan Note (Signed)
 Hold statin in setting pancreatitis  Monitor

## 2024-02-13 NOTE — Patient Instructions (Addendum)
  VISIT SUMMARY: You came in today with severe epigastric abdominal pain, similar to a previous episode of acute pancreatitis. We discussed the need for immediate evaluation at the emergency department. We also talked about your smoking habits and the importance of quitting smoking, as well as the need to reschedule your lung CT scan.  YOUR PLAN: EPIGASTRIC ABDOMINAL PAIN: You have severe pain in the upper middle part of your abdomen, which is similar to a previous episode of acute pancreatitis. -You need to go to the emergency department immediately for evaluation of your abdominal pain and to rule out any potential heart issues. - We will contact the emergency room at [facility] to expedite your care.  NICOTINE DEPENDENCE, CURRENT SMOKER: You are still smoking, although slightly less than before. You have expressed some interest in quitting smoking. -I encourage you to quit smoking. This will improve your overall health and may help with your current pain.  ABNORMAL LUNG IMAGING UNDER SURVEILLANCE: Your lung function tests are stable, but you need to reschedule your CT scan to monitor your lung condition. -We will schedule your CT scan for January to keep an eye on your lung condition.

## 2024-02-13 NOTE — Progress Notes (Deleted)
 [name]    [id number]    [birth year]  Primary Care Physician:[facility]  Referring Physician: [facility] [address] [place],  [place] [address]  Chief complaint: Follow-up for abnormal CT, evaluation for ILD  HPI: 68 y.o. with history of multinodular goiter, hyperlipidemia, diabetes, psoriasis Referred for evaluation of abnormal screening CT of the chest with concern for interstitial lung disease  Complains of chronic cough for many years which is nonproductive.  He has dyspnea on exertion He is an active smoker.  Recently evaluated by Dr. Karis and Dr.Waltonen, ENT at [facility] for multinodular goiter with tracheal compression and mild inspiratory stridor.   He underwent thyroidectomy at [facility] on 03/26/2021.  The surgery went well with no issues.  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  No mold, hot tub, Jacuzzi.  No feather pillows or comforter ILD questionnaire 02/26/2021-negative Smoking history: 30-pack-year smoker.  Continues to smoke half pack per day Travel history: No significant travel history Relevant family history: No family history of lung disease  Interim history: He is here for follow-up CT States that breathing is doing well with no new complaints today  Outpatient Encounter Medications as of 02/13/2024  Medication Sig   atorvastatin (LIPITOR) 20 MG tablet Take 1 tablet by mouth daily.   metFORMIN (GLUCOPHAGE-XR) 500 MG 24 hr tablet Take 500 mg by mouth every evening.   atorvastatin (LIPITOR) 20 MG tablet Take 20 mg by mouth daily.   betamethasone dipropionate 0.05 % cream Apply topically daily. (Patient not taking: Reported on 02/13/2024)   morphine  (MSIR) 15 MG tablet  Take 0.5 tablets (7.5 mg total) by mouth every 4 (four) hours as needed for severe pain (pain score 7-10).   ondansetron  (ZOFRAN -ODT) 4 MG disintegrating tablet Take 1 tablet (4 mg total) by mouth every 4 (four) hours as needed for nausea/vomiting (Patient not taking: Reported on 02/13/2024)   No facility-administered encounter medications on file as of 02/13/2024.   Physical Exam: Blood pressure (!) 122/58, pulse 68, temperature 98 F (36.7 C), temperature source Oral, height 6' 1 (1.854 m), weight 270 lb (122.5 kg), SpO2 96 %. Gen:      No acute distress HEENT:  EOMI, sclera anicteric Neck:     No masses; no thyromegaly Lungs:    Clear to auscultation bilaterally; normal respiratory effort CV:         Regular rate and rhythm; no murmurs Abd:      + bowel sounds; soft, non-tender; no palpable masses, no distension Ext:    No edema; adequate peripheral perfusion Skin:      Warm and dry; no rash Neuro: alert and oriented x 3 Psych: normal mood and affect   Data Reviewed: Imaging: CT low-dose 10/23/2020-multiple pulmonary nodules some of which are new.  Mild centrilobular and paraseptal emphysema.  Bilateral patchy groundglass attenuation and septal thickening in the mid to lower lungs which is worsened compared opacities.   High-resolution CT 03/17/2021-peripheral basilar predominant groundglass with subpleural reticulation, no honeycombing.  Indeterminate for UIP.  May be RB ILD versus NSIP.  High-resolution CT 04/13/2022-stable interstitial lung disease in alternate pattern.  No progression compared to 2023. I have reviewed the images personally.  PFTs: 04/16/2021 FVC 3.77 [  74%], FEV1 3.29 [84%], F/F 85, TLC 5.81 [76%], DLCO 24.93 [86%] I have reviewed the images personally.  Labs: CTD serologies 02/26/2021-negative  Assessment:  Follow-up for interstitial lung disease I have reviewed his CT with nonspecific groundglass changes at the base.  He does not have signs and symptoms of  connective tissue disease and CTD serologies are negative.  He does have some chemical exposure in his line of work but does not appear to be significant.    This may be RB ILD due to ongoing smoking.  Smoking cessation discussed.  We will continue to monitor closely  We also discussed further workup such as surgical lung biopsy or bronchoscopy with BAL and transbronchial biopsies but he wants to avoid invasive procedures and continue conservative management.  Active smoker Smoking cessation discussed.  He is not interested in quitting at present Reevaluate at return visit.  Time spent counseling-5 minutes  Plan/Recommendations: Get PFTs in 6 months Return to clinic in 6 months for reassessment  Navea Woodrow MD [facility] 02/13/2024, 9:22 AM  CC: [facility]

## 2024-02-13 NOTE — Assessment & Plan Note (Signed)
 BMI 37.4

## 2024-02-13 NOTE — H&P (Addendum)
 History and Physical    Patient: [name] FMW:[id number] DOB: 1955-03-31 DOA: 02/13/2024 DOS: the patient was seen and examined on 02/13/2024 PCP: [facility]  Patient coming from: Home  Chief Complaint:  Chief Complaint  Patient presents with   Abdominal Pain   HPI: [name] is a 68 y.o. male with medical history significant of interstitial lung disease, hyperlipidemia, type 2 diabetes presenting with pancreatitis.  Patient reports progressive loss of abdominal pain over the past 1 to 2 days.  Abdominal pain generalized across entire abdomen.  No nausea or vomiting.  No diarrhea.  Patient states he has a remote history of pancreatitis December last year diagnosed on ER evaluation.  Patient states he had outpatient follow-up with GI.  Otherwise normal eval apart from noted biliary sludge and gallstones.  Patient denies eating any high fatty foods.  Also denies any alcohol intake.  Denies any new medication changes.  Fevers or chills.  No focal hemiparesis or confusion.  Had an outpatient pulmonology follow-up today for interstitial lung disease.  Had normal follow-up though he reported 8-10 out of 10 abdominal pain.  Was redirected to the ER for further evaluation. In the ER afebrile, currently stable.  White count 11.2, hemoglobin 16.6, platelets 187.  Creatinine 0.99.  AST 27, ALT 45.  T. bili 1.0.  CT abdomen and pelvis  with acute interstitial pancreatitis and irregularity/mild gallbladder distention. Lipase 790.  Review of Systems: As mentioned in the history of present illness. All other systems reviewed and are negative. Past Medical History:  Diagnosis Date   Diabetes mellitus without complication (HCC)    Hyperlipidemia    History reviewed. No pertinent surgical history. Social History:  reports that he has been smoking cigarettes. He started smoking about 53 years ago. He has a 26.7 pack-year smoking history. He quit smokeless tobacco use  about 2 years ago.  His smokeless tobacco use included snuff. He reports that he does not drink alcohol and does not use drugs.  Allergies  Allergen Reactions   Penicillins Other (See Comments)    Per Patient, had a childhood reaction.     History reviewed. No pertinent family history.  Prior to Admission medications   Medication Sig Start Date End Date Taking? Authorizing Provider  atorvastatin (LIPITOR) 20 MG tablet Take 20 mg by mouth daily. 06/30/18  Yes [provider]  atorvastatin (LIPITOR) 20 MG tablet Take 1 tablet by mouth daily. 03/27/19  Yes [provider]  betamethasone dipropionate 0.05 % cream Apply topically daily. Patient not taking: No sig reported 04/02/22   [provider]  metFORMIN (GLUCOPHAGE-XR) 500 MG 24 hr tablet Take 500 mg by mouth every evening. 02/04/23   [provider]  morphine  (MSIR) 15 MG tablet Take 0.5 tablets (7.5 mg total) by mouth every 4 (four) hours as needed for severe pain (pain score 7-10). Patient not taking: Reported on 02/13/2024 02/22/23   Emil Share, DO  ondansetron  (ZOFRAN -ODT) 4 MG disintegrating tablet Take 1 tablet (4 mg total) by mouth every 4 (four) hours as needed for nausea/vomiting 02/22/23   Emil Share, DO    Physical Exam: Vitals:   02/13/24 1430 02/13/24 1600 02/13/24 1700 02/13/24 1747  BP: 120/68 124/71 125/74 120/69  Pulse: 63 60 66 63  Resp: 16 16 16 16   Temp:    98.7 F (37.1 C)  TempSrc:    Oral  SpO2: 98% 92% 97% 95%  Weight:  Height:       Physical Exam Constitutional:      Appearance: He is obese.  HENT:     Head: Normocephalic and atraumatic.     Nose: Nose normal.     Mouth/Throat:     Mouth: Mucous membranes are moist.  Eyes:     Pupils: Pupils are equal, round, and reactive to light.  Cardiovascular:     Rate and Rhythm: Normal rate and regular rhythm.  Pulmonary:     Effort: Pulmonary effort is normal.  Abdominal:     General: Bowel sounds are normal.   Musculoskeletal:        General: Normal range of motion.  Skin:    General: Skin is warm.  Neurological:     General: No focal deficit present.  Psychiatric:        Mood and Affect: Mood normal.     Data Reviewed:  There are no new results to review at this time.  CT ABDOMEN PELVIS W CONTRAST CLINICAL DATA:  elevated lipase, history of pancreatitis, epigastric abd pain.  EXAM: CT ABDOMEN AND PELVIS WITH CONTRAST  TECHNIQUE: Multidetector CT imaging of the abdomen and pelvis was performed using the standard protocol following bolus administration of intravenous contrast.  RADIATION DOSE REDUCTION: This exam was performed according to the departmental dose-optimization program which includes automated exposure control, adjustment of the mA and/or kV according to patient size and/or use of iterative reconstruction technique.  CONTRAST:  OMNIPAQUE  IOHEXOL  300 MG/ML  SOLN  COMPARISON:  CT scan abdomen and pelvis from 02/22/2023.  FINDINGS: Lower chest: There are peripheral/subpleural reticulations with associated bronchiectasis in the visualized bilateral lung bases, concerning for chronic interstitial lung disease. No focal mass, consolidation or pleural effusion. Normal heart size. No pericardial effusion.  Hepatobiliary: The liver is normal in size. Non-cirrhotic configuration. No suspicious mass. These is mild diffuse hepatic steatosis. No intrahepatic or extrahepatic bile duct dilation. The gallbladder is physiologically distended. Small to moderate volume dependent gallstones/sludge noted without imaging signs of acute cholecystitis.  Pancreas: There is mild fat stranding predominantly surrounding the pancreatic body/tail, in appropriate clinical settings, compatible with acute interstitial pancreatitis. There is no associated pancreatic necrosis or peripancreatic collection. The splenic vein is patent. No focal pancreatic lesion. Main pancreatic duct is  not dilated.  Spleen: Within normal limits. No focal lesion.  Adrenals/Urinary Tract: Adrenal glands are unremarkable. No suspicious renal mass. No hydronephrosis. No renal or ureteric calculi. Unremarkable urinary bladder.  Stomach/Bowel: There is a small sliding hiatal hernia. No disproportionate dilation of the small or large bowel loops. No evidence of abnormal bowel wall thickening or inflammatory changes. The appendix is unremarkable. There are multiple diverticula mainly in the left hemi colon, without imaging signs of diverticulitis.  Vascular/Lymphatic: No ascites or pneumoperitoneum. No abdominal or pelvic lymphadenopathy, by size criteria. No aneurysmal dilation of the major abdominal arteries. There are mild peripheral atherosclerotic vascular calcifications of the aorta and its major branches.  Reproductive: Normal size prostate. Symmetric seminal vesicles.  Other: There is a tiny fat containing umbilical hernia. The soft tissues and abdominal wall are otherwise unremarkable.  Musculoskeletal: No suspicious osseous lesions. There are mild multilevel degenerative changes in the visualized spine.  IMPRESSION: 1. Findings compatible with acute interstitial pancreatitis. No associated pancreatic necrosis or peripancreatic collection. 2. Multiple other nonacute observations, as described above.  Aortic Atherosclerosis (ICD10-I70.0).  Electronically Signed   By: Ree Molt M.D.   On: 02/13/2024 13:56  Lab Results  Component Value  Date   WBC 11.2 (H) 02/13/2024   HGB 16.6 02/13/2024   HCT 49.6 02/13/2024   MCV 85.1 02/13/2024   PLT 187 02/13/2024   Last metabolic panel Lab Results  Component Value Date   GLUCOSE 127 (H) 02/13/2024   NA 136 02/13/2024   K 4.6 02/13/2024   CL 99 02/13/2024   CO2 25 02/13/2024   BUN 15 02/13/2024   CREATININE 0.99 02/13/2024   GFRNONAA >60 02/13/2024   CALCIUM 10.3 02/13/2024   PROT 7.9 02/13/2024   ALBUMIN 4.4  02/13/2024   BILITOT 1.0 02/13/2024   ALKPHOS 94 02/13/2024   AST 27 02/13/2024   ALT 45 (H) 02/13/2024   ANIONGAP 12 02/13/2024    Assessment and Plan: * Pancreatitis Progressive generalized abdominal pain for  with noted lipase in 700s as well as CT scan concerning for pancreatitis Similar presentation 02/2023 with outpatient GI follow up and fairly stable workup.  Triglyceride and ETOH level pending  Noted biliary sludge and mild GB distension on CT scan  Will check RUQ u/s to correlate  LFTs WNL  Pain control  IVF hydration  ADAT  Otherwise follow closely    Obesity (BMI 30-39.9) BMI 37.4   HLD (hyperlipidemia) Hold statin in setting pancreatitis  Monitor    T2DM (type 2 diabetes mellitus) (HCC) Blood sugar in 120s SSI  A1C  Monitor    ILD (interstitial lung disease) (HCC) Stable from a respiratory standpoint Continue inhalers      Advance Care Planning:   Code Status: Full Code   Consults: None   Family Communication: Wife at the bedside   Severity of Illness: The appropriate patient status for this patient is OBSERVATION. Observation status is judged to be reasonable and necessary in order to provide the required intensity of service to ensure the patient's safety. The patient's presenting symptoms, physical exam findings, and initial radiographic and laboratory data in the context of their medical condition is felt to place them at decreased risk for further clinical deterioration. Furthermore, it is anticipated that the patient will be medically stable for discharge from the hospital within 2 midnights of admission.   Author: Elspeth JINNY Masters, MD 02/13/2024 6:56 PM  For on call review [email or web address].

## 2024-02-13 NOTE — ED Provider Notes (Signed)
  EMERGENCY DEPARTMENT AT [facility] Provider Note   CSN: [id number] Arrival date & time: 02/13/24  9047     Patient presents with: Abdominal Pain   [name] is a 68 y.o. male.   68 year old male presenting with epigastric abdominal pain.  Patient notes that pain began yesterday but ramped up this morning, describes it as like being punched in the stomach, endorses nausea but no vomiting, no diarrhea, no fever.  Patient with history of pancreatitis approximately 1 year ago, was told that his case was mild at that time, cause of this was unclear.  Denies alcohol use, reports that he may drink alcohol once a year and has not had any alcohol recently.  No history of gallbladder pathology, no prior abdominal surgeries.  Patient is on atorvastatin for treatment of high cholesterol.  Denies chest pain or shortness of breath.   Abdominal Pain      Prior to Admission medications   Medication Sig Start Date End Date Taking? Authorizing Provider  atorvastatin (LIPITOR) 20 MG tablet Take 20 mg by mouth daily. 06/30/18   [provider]  atorvastatin (LIPITOR) 20 MG tablet Take 1 tablet by mouth daily. 03/27/19   [provider]  betamethasone dipropionate 0.05 % cream Apply topically daily. Patient not taking: Reported on 02/13/2024 04/02/22   [provider]  metFORMIN (GLUCOPHAGE-XR) 500 MG 24 hr tablet Take 500 mg by mouth every evening. 02/04/23   [provider]  morphine  (MSIR) 15 MG tablet Take 0.5 tablets (7.5 mg total) by mouth every 4 (four) hours as needed for severe pain (pain score 7-10). 02/22/23   Emil Share, DO  ondansetron  (ZOFRAN -ODT) 4 MG disintegrating tablet Take 1 tablet (4 mg total) by mouth every 4 (four) hours as needed for nausea/vomiting Patient not taking: Reported on 02/13/2024 02/22/23   Emil Share, DO    Allergies: Penicillins    Review of Systems  Gastrointestinal:  Positive for abdominal pain.     Updated Vital Signs  Vitals:   02/13/24 1400 02/13/24 1412 02/13/24 1430 02/13/24 1600  BP: 116/62  120/68 124/71  Pulse: 62  63 60  Resp: 15  16 16   Temp:  98.6 F (37 C)    TempSrc:  Oral    SpO2: 94%  98% 92%  Weight:      Height:         Physical Exam Vitals and nursing note reviewed.  HENT:     Head: Normocephalic.  Eyes:     Extraocular Movements: Extraocular movements intact.  Cardiovascular:     Rate and Rhythm: Normal rate and regular rhythm.     Heart sounds: Normal heart sounds.  Pulmonary:     Effort: Pulmonary effort is normal.     Breath sounds: Normal breath sounds.  Abdominal:     Palpations: Abdomen is soft.     Tenderness: There is abdominal tenderness (epigastric). There is no guarding.  Musculoskeletal:     Cervical back: Normal range of motion.     Comments: Moves all extremities spontaneously without difficulty  Skin:    General: Skin is warm and dry.  Neurological:     Mental Status: He is alert and oriented to person, place, and time.     (all labs ordered are listed, but only abnormal results are displayed) Labs Reviewed  LIPASE, BLOOD - Abnormal; Notable for the following components:      Result Value   Lipase 790 (*)    All  other components within normal limits  COMPREHENSIVE METABOLIC PANEL WITH GFR - Abnormal; Notable for the following components:   Glucose, Bld 127 (*)    ALT 45 (*)    All other components within normal limits  CBC - Abnormal; Notable for the following components:   WBC 11.2 (*)    RBC 5.83 (*)    All other components within normal limits  URINALYSIS, ROUTINE W REFLEX MICROSCOPIC  TRIGLYCERIDES    EKG: None  Radiology: CT ABDOMEN PELVIS W CONTRAST Result Date: 02/13/2024 CLINICAL DATA:  elevated lipase, history of pancreatitis, epigastric abd pain. EXAM: CT ABDOMEN AND PELVIS WITH CONTRAST TECHNIQUE: Multidetector CT imaging of the abdomen and pelvis was performed using the standard protocol following  bolus administration of intravenous contrast. RADIATION DOSE REDUCTION: This exam was performed according to the departmental dose-optimization program which includes automated exposure control, adjustment of the mA and/or kV according to patient size and/or use of iterative reconstruction technique. CONTRAST:  OMNIPAQUE  IOHEXOL  300 MG/ML  SOLN COMPARISON:  CT scan abdomen and pelvis from 02/22/2023. FINDINGS: Lower chest: There are peripheral/subpleural reticulations with associated bronchiectasis in the visualized bilateral lung bases, concerning for chronic interstitial lung disease. No focal mass, consolidation or pleural effusion. Normal heart size. No pericardial effusion. Hepatobiliary: The liver is normal in size. Non-cirrhotic configuration. No suspicious mass. These is mild diffuse hepatic steatosis. No intrahepatic or extrahepatic bile duct dilation. The gallbladder is physiologically distended. Small to moderate volume dependent gallstones/sludge noted without imaging signs of acute cholecystitis. Pancreas: There is mild fat stranding predominantly surrounding the pancreatic body/tail, in appropriate clinical settings, compatible with acute interstitial pancreatitis. There is no associated pancreatic necrosis or peripancreatic collection. The splenic vein is patent. No focal pancreatic lesion. Main pancreatic duct is not dilated. Spleen: Within normal limits. No focal lesion. Adrenals/Urinary Tract: Adrenal glands are unremarkable. No suspicious renal mass. No hydronephrosis. No renal or ureteric calculi. Unremarkable urinary bladder. Stomach/Bowel: There is a small sliding hiatal hernia. No disproportionate dilation of the small or large bowel loops. No evidence of abnormal bowel wall thickening or inflammatory changes. The appendix is unremarkable. There are multiple diverticula mainly in the left hemi colon, without imaging signs of diverticulitis. Vascular/Lymphatic: No ascites or  pneumoperitoneum. No abdominal or pelvic lymphadenopathy, by size criteria. No aneurysmal dilation of the major abdominal arteries. There are mild peripheral atherosclerotic vascular calcifications of the aorta and its major branches. Reproductive: Normal size prostate. Symmetric seminal vesicles. Other: There is a tiny fat containing umbilical hernia. The soft tissues and abdominal wall are otherwise unremarkable. Musculoskeletal: No suspicious osseous lesions. There are mild multilevel degenerative changes in the visualized spine. IMPRESSION: 1. Findings compatible with acute interstitial pancreatitis. No associated pancreatic necrosis or peripancreatic collection. 2. Multiple other nonacute observations, as described above. Aortic Atherosclerosis (ICD10-I70.0). Electronically Signed   By: Ree Molt M.D.   On: 02/13/2024 13:56     Procedures   Medications Ordered in the ED  morphine  (PF) 4 MG/ML injection 4 mg (has no administration in time range)  ondansetron  (ZOFRAN ) tablet 4 mg (has no administration in time range)    Or  ondansetron  (ZOFRAN ) injection 4 mg (has no administration in time range)  morphine  (PF) 4 MG/ML injection 4 mg (4 mg Intravenous Given 02/13/24 1205)  sodium chloride  0.9 % bolus 1,000 mL (0 mLs Intravenous Stopped 02/13/24 1236)  ondansetron  (ZOFRAN ) injection 4 mg (4 mg Intravenous Given 02/13/24 1204)  iohexol  (OMNIPAQUE ) 300 MG/ML solution 100 mL (100 mLs Intravenous  Contrast Given 02/13/24 1245)  fentaNYL  (SUBLIMAZE ) injection 50 mcg (50 mcg Intravenous Given 02/13/24 1415)  sodium chloride  0.9 % bolus 1,000 mL (1,000 mLs Intravenous New Bag/Given 02/13/24 1544)                                    Medical Decision Making This patient presents to the ED for concern of abdominal pain, this involves an extensive number of treatment options, and is a complaint that carries with it a high risk of complications and morbidity.  The differential diagnosis includes  pancreatitis, gastroenteritis, diverticulitis, appendicitis, other intra-abdominal pathology   Co morbidities that complicate the patient evaluation  History of pancreatitis, history of interstitial lung disease   Additional history obtained:  Additional history obtained from record review External records from outside source obtained and reviewed including prior PCP note   Lab Tests:  I Ordered, and personally interpreted labs.  The pertinent results include: CBC notable for mild leukocytosis of 11.2, otherwise unremarkable.  CMP notable for borderline elevation in ALT at 45, otherwise unremarkable.  Lipase significantly elevated at 790, during prior episode of pancreatitis patient's lipase was only elevated to 84.  Urinalysis within normal limits.   Imaging Studies ordered:  I ordered imaging studies including CT abdomen/pelvis, RUQ US  I independently visualized and interpreted imaging which showed  CT: 1. Findings compatible with acute interstitial pancreatitis. No associated pancreatic necrosis or peripancreatic collection. 2. Multiple other nonacute observations, as described above. RUQ US : pending at time of admission I agree with the radiologist interpretation   Cardiac Monitoring: / EKG:  The patient was maintained on a cardiac monitor.  I personally viewed and interpreted the cardiac monitored which showed an underlying rhythm of: NSR    Consultations Obtained:  I requested consultation with the hospitalist,  and discussed lab and imaging findings as well as pertinent plan - they recommend: I spoke with Dr. Eldonna who recommends obtaining right upper quadrant ultrasound to assess for gallbladder pathology as contributing to patient's recurrent pancreatitis, agrees that admission is appropriate   Problem List / ED Course / Critical interventions / Medication management  I ordered medication including morphine  and fentanyl  for pain, Zofran   for nausea, IV fluids for  pancreatitis Reevaluation of the patient after these medicines showed that the patient improved I have reviewed the patients home medicines and have made adjustments as needed   Social Determinants of Health:  Tobacco use   Test / Admission - Considered:  Physical exam notable as above, patient does have tenderness palpation in the epigastric region.  Labs are consistent with pancreatitis, with lipase elevated to 790.  Patient has history of pancreatitis approximately 1 year ago, at that time his lipase was minimally elevated and he was not admitted to the hospital for treatment of this but was discharged with pain medication and antiemetics.  He has had no recurrences of pancreatitis until now.  CT imaging is notable as above, findings consistent with pancreatitis, patient also found to have stone/sludge in the gallbladder, but no evidence of cholecystitis.  Low suspicion for alcohol induced pancreatitis as patient does not endorse alcohol use.  Triglyceride levels pending, patient is on atorvastatin and reports that he is compliant with this medication.  Likely pancreatitis induced by gallbladder pathology versus hypertriglyceridemia. Vitals are within normal limits, no fever or tachycardia. I spoke with the hospitalist service as above, they agree that this patient is appropriate  for admission, Dr. Eldonna with the hospitalist service recommends obtaining a right upper quadrant ultrasound while patient is pending transfer to assess for gallbladder pathology that may be contributing to his recurrent pancreatitis, right upper quadrant ultrasound ordered.    Staffed with Dr. Rogelia   Amount and/or Complexity of Data Reviewed Labs: ordered. Radiology: ordered.  Risk Prescription drug management. Decision regarding hospitalization.        Final diagnoses:  Acute pancreatitis, unspecified complication status, unspecified pancreatitis type    ED Discharge Orders     None           Glendia Rocky SAILOR, [place] 02/13/24 1625    Rogelia Jerilynn RAMAN, MD 02/21/24 1216

## 2024-02-13 NOTE — ED Triage Notes (Signed)
 Generalized abd pain since yesterday. States worse in upper area. Endorses nausea.denies vomiting and diarrhea.

## 2024-02-13 NOTE — ED Notes (Signed)
 Report given to Floor RN

## 2024-02-14 ENCOUNTER — Encounter (HOSPITAL_COMMUNITY): Payer: Self-pay | Admitting: Family Medicine

## 2024-02-14 DIAGNOSIS — K859 Acute pancreatitis without necrosis or infection, unspecified: Secondary | ICD-10-CM | POA: Diagnosis not present

## 2024-02-14 DIAGNOSIS — E669 Obesity, unspecified: Secondary | ICD-10-CM | POA: Diagnosis not present

## 2024-02-14 LAB — COMPREHENSIVE METABOLIC PANEL WITH GFR
ALT: 29 U/L (ref 0–44)
AST: 16 U/L (ref 15–41)
Albumin: 3.3 g/dL — ABNORMAL LOW (ref 3.5–5.0)
Alkaline Phosphatase: 64 U/L (ref 38–126)
Anion gap: 8 (ref 5–15)
BUN: 14 mg/dL (ref 8–23)
CO2: 26 mmol/L (ref 22–32)
Calcium: 8.7 mg/dL — ABNORMAL LOW (ref 8.9–10.3)
Chloride: 105 mmol/L (ref 98–111)
Creatinine, Ser: 1.13 mg/dL (ref 0.61–1.24)
GFR, Estimated: >60 mL/min (ref >60)
Glucose, Bld: 114 mg/dL — ABNORMAL HIGH (ref 70–99)
Potassium: 4.2 mmol/L (ref 3.5–5.1)
Sodium: 139 mmol/L (ref 135–145)
Total Bilirubin: 1.2 mg/dL (ref 0.0–1.2)
Total Protein: 6.3 g/dL — ABNORMAL LOW (ref 6.5–8.1)

## 2024-02-14 LAB — CBC
HCT: 45 % (ref 39.0–52.0)
Hemoglobin: 14.9 g/dL (ref 13.0–17.0)
MCH: 28.5 pg (ref 26.0–34.0)
MCHC: 33.1 g/dL (ref 30.0–36.0)
MCV: 86 fL (ref 80.0–100.0)
Platelets: 162 K/uL (ref 150–400)
RBC: 5.23 MIL/uL (ref 4.22–5.81)
RDW: 13.4 % (ref 11.5–15.5)
WBC: 8.3 K/uL (ref 4.0–10.5)
nRBC: 0 % (ref 0.0–0.2)

## 2024-02-14 MED ORDER — HYDROMORPHONE HCL 1 MG/ML IJ SOLN: 0.5000 mg | INTRAMUSCULAR | Status: DC | PRN

## 2024-02-14 MED ORDER — INDOCYANINE GREEN 25 MG IJ SOLR
1.2500 mg | Freq: Once | INTRAMUSCULAR | Status: AC
  Administered 2024-02-15: 1.25 mg via INTRAVENOUS
  Filled 2024-02-14: qty 10

## 2024-02-14 MED ORDER — OXYCODONE HCL 5 MG PO TABS: 5.0000 mg | ORAL_TABLET | ORAL | Status: DC | PRN

## 2024-02-14 MED ORDER — SODIUM CHLORIDE 0.9 % IV SOLN
2.0000 g | INTRAVENOUS | Status: AC
  Administered 2024-02-15: 2 g via INTRAVENOUS
  Filled 2024-02-14: qty 20

## 2024-02-14 NOTE — Progress Notes (Signed)
 PROGRESS NOTE    [name]  FMW:[id number] DOB: April 15, 1955 DOA: 02/13/2024 PCP: [facility] [place]  Subjective:  No acute events overnight. Seen and examined at bedside with wife present. Reports feeling much better and wondering if he will be going home soon. Denies any abdominal pain, nausea and vomiting, constipation.   Hospital Course:  68 y.o. male with medical history significant of acute mild pancreatitis 02/2023, interstitial lung disease, hyperlipidemia, type 2 diabetes presenting with progressive abdominal pain with associated nausea and vomiting over the past 1 to 2 days. Found to have acute pancreatitis.    Assessment and Plan: Acute Recurrent Mild Pancreatitis Similar presentation 02/2023 with outpatient GI follow up and fairly stable workup. Progressive generalized abdominal pain, nausea and vomiting lipase in 700s Triglyceride not elevated ETOH level low LFTs WNL  Likely in the setting symptomatic gallstone disease CT scan concerning for acute interstitial pancreatitis, biliary sludge and mild GB distension  RUQ u/s showed no cholelithiasis, and normal CBD Pain resolved with fentanyl once in the ED and hasn't recurred Advance from NPO to full liquid diet. ADAT Stop IVFs if tolerating PO well Discussed with general surgery for possible cholecystectomy prior to discharge since mild pancreatitis, awaiting recs  Obesity (BMI 30-39.9) BMI 37.4   HLD (hyperlipidemia) Hold statin in setting pancreatitis  Likely resume on discharge  T2DM (type 2 diabetes mellitus) (HCC) SSI  A1C    ILD (interstitial lung disease) (HCC) Stable from a respiratory standpoint Continue inhalers  DVT prophylaxis: enoxaparin (LOVENOX) injection 40 mg Start: 02/13/24 1915  Lovenox   Code Status: Full Code Family Communication: updated wife at bedside Disposition Plan: Home  Reason for continuing need for hospitalization: IV hydration, monitor for oral  tolerance, surgery consultation for possible cholecystectomy  Objective: Vitals:   02/13/24 1700 02/13/24 1747 02/14/24 0340 02/14/24 0738  BP: 125/74 120/69 105/73 118/74  Pulse: 66 63 (!) 56 (!) 52  Resp: 16 16 19 18   Temp:  98.7 F (37.1 C) 97.8 F (36.6 C) 97.9 F (36.6 C)  TempSrc:  Oral Oral Oral  SpO2: 97% 95% 98% 97%  Weight:      Height:        Intake/Output Summary (Last 24 hours) at 02/14/2024 1035 Last data filed at 02/14/2024 0600 Gross per 24 hour  Intake 2713.27 ml  Output --  Net 2713.27 ml   Filed Weights   02/13/24 1004  Weight: 125.2 kg    Examination:  Physical Exam Vitals and nursing note reviewed.  Constitutional:      General: He is not in acute distress.    Appearance: He is obese. He is not ill-appearing.  HENT:     Head: Normocephalic and atraumatic.  Cardiovascular:     Rate and Rhythm: Normal rate and regular rhythm.     Pulses: Normal pulses.     Heart sounds: Normal heart sounds.  Pulmonary:     Effort: Pulmonary effort is normal.     Breath sounds: Normal breath sounds.  Abdominal:     General: Bowel sounds are normal. There is no distension.     Palpations: Abdomen is soft.     Tenderness: There is no abdominal tenderness. There is no guarding or rebound.  Musculoskeletal:     Right lower leg: No edema.     Left lower leg: No edema.  Neurological:     Mental Status: He is alert.     Data Reviewed: I have personally reviewed following labs  and imaging studies  CBC: Recent Labs  Lab 02/13/24 1006 02/14/24 0443  WBC 11.2* 8.3  HGB 16.6 14.9  HCT 49.6 45.0  MCV 85.1 86.0  PLT 187 162   Basic Metabolic Panel: Recent Labs  Lab 02/13/24 1006 02/14/24 0443  NA 136 139  K 4.6 4.2  CL 99 105  CO2 25 26  GLUCOSE 127* 114*  BUN 15 14  CREATININE 0.99 1.13  CALCIUM 10.3 8.7*   GFR: Estimated Creatinine Clearance: 83.1 mL/min (by C-G formula based on SCr of 1.13 mg/dL). Liver Function Tests: Recent Labs  Lab  02/13/24 1006 02/14/24 0443  AST 27 16  ALT 45* 29  ALKPHOS 94 64  BILITOT 1.0 1.2  PROT 7.9 6.3*  ALBUMIN 4.4 3.3*   Recent Labs  Lab 02/13/24 1006  LIPASE 790*   No results for input(s): AMMONIA in the last 168 hours. Coagulation Profile: No results for input(s): INR, PROTIME in the last 168 hours. Cardiac Enzymes: No results for input(s): CKTOTAL, CKMB, CKMBINDEX, TROPONINI in the last 168 hours. ProBNP, BNP (last 5 results) No results for input(s): PROBNP, BNP in the last 8760 hours. HbA1C: No results for input(s): HGBA1C in the last 72 hours. CBG: No results for input(s): GLUCAP in the last 168 hours. Lipid Profile: Recent Labs    02/13/24 1006  TRIG 112   Thyroid  Function Tests: No results for input(s): TSH, T4TOTAL, FREET4, T3FREE, THYROIDAB in the last 72 hours. Anemia Panel: No results for input(s): VITAMINB12, FOLATE, FERRITIN, TIBC, IRON, RETICCTPCT in the last 72 hours. Sepsis Labs: No results for input(s): PROCALCITON, LATICACIDVEN in the last 168 hours.  No results found for this or any previous visit (from the past 240 hours).   Radiology Studies: US  Abdomen Limited RUQ (LIVER/GB) Result Date: 02/13/2024 EXAM: Right Upper Quadrant Abdominal Ultrasound 02/13/2024 09:16:00 PM TECHNIQUE: Real-time ultrasonography of the right upper quadrant of the abdomen was performed. COMPARISON: Comparison with same day CT abdomen and pelvis. CLINICAL HISTORY: Pancreatitis. FINDINGS: LIVER: Hepatic steatosis with focal fatty sparing along the gallbladder fossa. Patent portal vein with antegrade flow. No intrahepatic biliary dilation. No evidence of mass. BILIARY SYSTEM: No pericholecystic fluid or wall thickening. No cholelithiasis. Common bile duct measures 2 mm. OTHER: No right upper quadrant ascites. Pancreas is not well visualized due to patient's body habitus and overlying bowel gas. IMPRESSION: 1. Hepatic steatosis. 2. No  biliary dilation. 3. Pancreas not well visualized due to body habitus and bowel gas. Electronically signed by: Norman Gatlin MD 02/13/2024 09:44 PM EST RP Workstation: [id number]   CT ABDOMEN PELVIS W CONTRAST Result Date: 02/13/2024 CLINICAL DATA:  elevated lipase, history of pancreatitis, epigastric abd pain. EXAM: CT ABDOMEN AND PELVIS WITH CONTRAST TECHNIQUE: Multidetector CT imaging of the abdomen and pelvis was performed using the standard protocol following bolus administration of intravenous contrast. RADIATION DOSE REDUCTION: This exam was performed according to the departmental dose-optimization program which includes automated exposure control, adjustment of the mA and/or kV according to patient size and/or use of iterative reconstruction technique. CONTRAST:  OMNIPAQUE  IOHEXOL  300 MG/ML  SOLN COMPARISON:  CT scan abdomen and pelvis from 02/22/2023. FINDINGS: Lower chest: There are peripheral/subpleural reticulations with associated bronchiectasis in the visualized bilateral lung bases, concerning for chronic interstitial lung disease. No focal mass, consolidation or pleural effusion. Normal heart size. No pericardial effusion. Hepatobiliary: The liver is normal in size. Non-cirrhotic configuration. No suspicious mass. These is mild diffuse hepatic steatosis. No intrahepatic or extrahepatic bile duct dilation. The  gallbladder is physiologically distended. Small to moderate volume dependent gallstones/sludge noted without imaging signs of acute cholecystitis. Pancreas: There is mild fat stranding predominantly surrounding the pancreatic body/tail, in appropriate clinical settings, compatible with acute interstitial pancreatitis. There is no associated pancreatic necrosis or peripancreatic collection. The splenic vein is patent. No focal pancreatic lesion. Main pancreatic duct is not dilated. Spleen: Within normal limits. No focal lesion. Adrenals/Urinary Tract: Adrenal glands are unremarkable. No  suspicious renal mass. No hydronephrosis. No renal or ureteric calculi. Unremarkable urinary bladder. Stomach/Bowel: There is a small sliding hiatal hernia. No disproportionate dilation of the small or large bowel loops. No evidence of abnormal bowel wall thickening or inflammatory changes. The appendix is unremarkable. There are multiple diverticula mainly in the left hemi colon, without imaging signs of diverticulitis. Vascular/Lymphatic: No ascites or pneumoperitoneum. No abdominal or pelvic lymphadenopathy, by size criteria. No aneurysmal dilation of the major abdominal arteries. There are mild peripheral atherosclerotic vascular calcifications of the aorta and its major branches. Reproductive: Normal size prostate. Symmetric seminal vesicles. Other: There is a tiny fat containing umbilical hernia. The soft tissues and abdominal wall are otherwise unremarkable. Musculoskeletal: No suspicious osseous lesions. There are mild multilevel degenerative changes in the visualized spine. IMPRESSION: 1. Findings compatible with acute interstitial pancreatitis. No associated pancreatic necrosis or peripancreatic collection. 2. Multiple other nonacute observations, as described above. Aortic Atherosclerosis (ICD10-I70.0). Electronically Signed   By: Ree Molt M.D.   On: 02/13/2024 13:56    Scheduled Meds:  enoxaparin (LOVENOX) injection  40 mg Subcutaneous Q24H   Continuous Infusions:  sodium chloride  100 mL/hr at 02/14/24 0743     LOS: 0 days   Norval Bar, MD  [facility]  02/14/2024, 10:35 AM

## 2024-02-14 NOTE — Progress Notes (Signed)
 OT Cancellation Note  Patient Details Name: [name] MRN: [id number] DOB: 1955/08/24   Cancelled Treatment:    Reason Eval/Treat Not Completed: OT screened, no needs identified, will sign off. Per PT, pt is independent. Will follow up as schedule allows.   [name] FREDERICK, OTR/L [facility] Office: [phone]   [name] 02/14/2024, 9:58 AM

## 2024-02-14 NOTE — Evaluation (Signed)
 Physical Therapy Evaluation & Discharge Patient Details Name: [name] MRN: [id number] DOB: 12-11-1955 Today's Date: 02/14/2024  History of Present Illness  68 y.o. male admitted 02/13/24 with abdominal pain. Workup for suspected pancreatitis. PMH includes ILD, DM2, HLD.  Clinical Impression  Patient evaluated by Physical Therapy with no further acute PT needs identified. PTA, pt independent, works, drives, lives with supportive wife. Today, pt indep with mobility and self-care tasks. All education has been completed and the patient has no further questions. Acute PT is signing off. Thank you for this referral.      HR 67, SpO2 96% on RA    If plan is discharge home, recommend the following:  N/A   Can travel by private vehicle   Yes     Equipment Recommendations None recommended by PT  Recommendations for Other Services       Functional Status Assessment Patient has not had a recent decline in their functional status     Precautions / Restrictions Precautions Precautions: None Recall of Precautions/Restrictions: Intact Restrictions Weight Bearing Restrictions Per Provider Order: No      Mobility  Bed Mobility Overal bed mobility: Independent                  Transfers Overall transfer level: Independent                      Ambulation/Gait Ambulation/Gait assistance: Independent Gait Distance (Feet): 600 Feet Assistive device: None Gait Pattern/deviations: WFL(Within Functional Limits)          Stairs            Wheelchair Mobility     Tilt Bed    Modified Rankin (Stroke Patients Only)       Balance Overall balance assessment: Independent                                           Pertinent Vitals/Pain Pain Assessment Pain Assessment: No/denies pain    Home Living Family/patient expects to be discharged to:: Private residence Living Arrangements: Spouse/significant other;Children Available Help  at Discharge: Family;Available 24 hours/day Type of Home: House Home Access: Stairs to enter Entrance Stairs-Rails: Right Entrance Stairs-Number of Steps: 3   Home Layout: One level Home Equipment: Cane - single point Additional Comments: lives with wife who drives and can assist as needed    Prior Function Prior Level of Function : Independent/Modified Independent;Working/employed;Driving             Mobility Comments: indep without DME, works full-time as curator, drives ADLs Comments: independent with ADL/iADLs     Extremity/Trunk Assessment   Upper Extremity Assessment Upper Extremity Assessment: Overall WFL for tasks assessed    Lower Extremity Assessment Lower Extremity Assessment: Overall WFL for tasks assessed       Communication   Communication Communication: No apparent difficulties    Cognition Arousal: Alert Behavior During Therapy: WFL for tasks assessed/performed   PT - Cognitive impairments: No apparent impairments                         Following commands: Intact       Cueing       General Comments General comments (skin integrity, edema, etc.): educ re: role of acute PT, POC, activity recommendations, importance of mobility    Exercises  Assessment/Plan    PT Assessment Patient does not need any further PT services  PT Problem List         PT Treatment Interventions      PT Goals (Current goals can be found in the Care Plan section)  Acute Rehab PT Goals PT Goal Formulation: All assessment and education complete, DC therapy    Frequency       Co-evaluation               AM-PAC PT 6 Clicks Mobility  Outcome Measure Help needed turning from your back to your side while in a flat bed without using bedrails?: None Help needed moving from lying on your back to sitting on the side of a flat bed without using bedrails?: None Help needed moving to and from a bed to a chair (including a wheelchair)?:  None Help needed standing up from a chair using your arms (e.g., wheelchair or bedside chair)?: None Help needed to walk in hospital room?: None Help needed climbing 3-5 steps with a railing? : None 6 Click Score: 24    End of Session   Activity Tolerance: Patient tolerated treatment well Patient left: in bed;with call bell/phone within reach;with family/visitor present Nurse Communication: Mobility status PT Visit Diagnosis: Other abnormalities of gait and mobility (R26.89)    Time: 9090-9079 PT Time Calculation (min) (ACUTE ONLY): 11 min   Charges:   PT Evaluation $PT Eval Low Complexity: 1 Low   PT General Charges $$ ACUTE PT VISIT: 1 Visit       Darice Almas, PT, DPT Acute Rehabilitation Services  Personal: Secure Chat Rehab Office: [phone]  Darice LITTIE Almas 02/14/2024, 9:26 AM

## 2024-02-14 NOTE — H&P (View-Only) (Signed)
 [name] 10-03-1955  [id number].    Requesting MD: Dr. Norval Bar Chief Complaint/Reason for Consult: gallstone pancreatitis  HPI:  This is a pleasant 68 yo with a history of DM and HLD who began having significant epigastric abdominal pain on Saturday.  This progressively worsened.  He had no other symptoms such as N/V/D, chest pain, fevers, SOB, etc.  He has had a previous episode similar to this but not as severe last year.  He presented to the ED yesterday when the pain was so bad he couldn't stand it.  He was found to have pancreatitis.  His CT showed some stones/sludge.  He is already improved today with minimal pain.  We have been asked to see him for consideration of lap chole.  ROS: ROS: see HPI  History reviewed. No pertinent family history.  Past Medical History:  Diagnosis Date   Diabetes mellitus without complication (HCC)    Hyperlipidemia     Past Surgical History:  Procedure Laterality Date   THYROIDECTOMY, PARTIAL      Social History:  reports that he has been smoking cigarettes. He started smoking about 53 years ago. He has a 26.7 pack-year smoking history. He quit smokeless tobacco use about 2 years ago.  His smokeless tobacco use included snuff. He reports that he does not drink alcohol and does not use drugs.  Allergies:  Allergies  Allergen Reactions   Penicillins Other (See Comments)    Per Patient, had a childhood reaction.     Medications Prior to Admission  Medication Sig Dispense Refill   atorvastatin (LIPITOR) 20 MG tablet Take 20 mg by mouth daily.     atorvastatin (LIPITOR) 20 MG tablet Take 1 tablet by mouth daily.     betamethasone dipropionate 0.05 % cream Apply topically daily. (Patient not taking: No sig reported)     metFORMIN (GLUCOPHAGE-XR) 500 MG 24 hr tablet Take 500 mg by mouth every evening.     morphine  (MSIR) 15 MG tablet Take 0.5 tablets (7.5 mg total) by mouth every 4 (four) hours as needed for severe pain (pain  score 7-10). (Patient not taking: Reported on 02/13/2024) 7 tablet 0   ondansetron  (ZOFRAN -ODT) 4 MG disintegrating tablet Take 1 tablet (4 mg total) by mouth every 4 (four) hours as needed for nausea/vomiting 20 tablet 0     Physical Exam: Blood pressure 118/74, pulse (!) 52, temperature 97.9 F (36.6 C), temperature source Oral, resp. rate 18, height 6' (1.829 m), weight 125.2 kg, SpO2 97%. General: pleasant, WD, WN white male who is sitting up in his chair in NAD HEENT: head is normocephalic, atraumatic.  Sclera are noninjected.  PERRL.  Ears and nose without any masses or lesions.  Mouth is pink and moist Heart: regular, rate, and rhythm.  Normal s1,s2. No obvious murmurs, gallops, or rubs noted. Lungs: CTAB, no wheezes, rhonchi, or rales noted.  Respiratory effort nonlabored Abd: soft, minimal epigastric tenderness, but essentially resolved, ND, +BS, no masses.  Small umbilical hernia noted Psych: A&Ox3 with an appropriate affect.   Results for orders placed or performed during the hospital encounter of 02/13/24 (from the past 48 hours)  Lipase, blood     Status: Abnormal   Collection Time: 02/13/24 10:06 AM  Result Value Ref Range   Lipase 790 (H) 11 - 51 U/L    Comment: Performed at [facility], [address] [place], [place] [address]  Comprehensive metabolic panel     Status: Abnormal  Collection Time: 02/13/24 10:06 AM  Result Value Ref Range   Sodium 136 135 - 145 mmol/L   Potassium 4.6 3.5 - 5.1 mmol/L   Chloride 99 98 - 111 mmol/L   CO2 25 22 - 32 mmol/L   Glucose, Bld 127 (H) 70 - 99 mg/dL    Comment: Glucose reference range applies only to samples taken after fasting for at least 8 hours.   BUN 15 8 - 23 mg/dL   Creatinine, Ser 9.00 0.61 - 1.24 mg/dL   Calcium 89.6 8.9 - 89.6 mg/dL   Total Protein 7.9 6.5 - 8.1 g/dL   Albumin 4.4 3.5 - 5.0 g/dL   AST 27 15 - 41 U/L   ALT 45 (H) 0 - 44 U/L   Alkaline Phosphatase 94 38 - 126 U/L   Total  Bilirubin 1.0 0.0 - 1.2 mg/dL   GFR, Estimated >39 >39 mL/min    Comment: (NOTE) Calculated using the CKD-EPI Creatinine Equation (2021)    Anion gap 12 5 - 15    Comment: Performed at [facility], [address], [place], [place] [address]  CBC     Status: Abnormal   Collection Time: 02/13/24 10:06 AM  Result Value Ref Range   WBC 11.2 (H) 4.0 - 10.5 K/uL   RBC 5.83 (H) 4.22 - 5.81 MIL/uL   Hemoglobin 16.6 13.0 - 17.0 g/dL   HCT 50.3 60.9 - 47.9 %   MCV 85.1 80.0 - 100.0 fL   MCH 28.5 26.0 - 34.0 pg   MCHC 33.5 30.0 - 36.0 g/dL   RDW 86.5 88.4 - 84.4 %   Platelets 187 150 - 400 K/uL   nRBC 0.0 0.0 - 0.2 %    Comment: Performed at [facility], [address], [place], [place] [address]  Urinalysis, Routine w reflex microscopic -Urine, Clean Catch     Status: None   Collection Time: 02/13/24 10:06 AM  Result Value Ref Range   Color, Urine YELLOW YELLOW   APPearance CLEAR CLEAR   Specific Gravity, Urine 1.017 1.005 - 1.030   pH 5.5 5.0 - 8.0   Glucose, UA NEGATIVE NEGATIVE mg/dL   Hgb urine dipstick NEGATIVE NEGATIVE   Bilirubin Urine NEGATIVE NEGATIVE   Ketones, ur NEGATIVE NEGATIVE mg/dL   Protein, ur NEGATIVE NEGATIVE mg/dL   Nitrite NEGATIVE NEGATIVE   Leukocytes,Ua NEGATIVE NEGATIVE    Comment: Performed at [facility], [address], [place], [place] [address]  Triglycerides     Status: None   Collection Time: 02/13/24 10:06 AM  Result Value Ref Range   Triglycerides 112 <150 mg/dL    Comment: Performed at [facility], [address], [place], [place] [address]  HIV Antibody (routine testing w rflx)     Status: None   Collection Time: 02/13/24  7:10 PM  Result Value Ref Range   HIV Screen 4th Generation wRfx Non Reactive Non Reactive    Comment: Performed at [facility], [address] [place], [place] [address]  Ethanol     Status: None   Collection Time: 02/13/24  7:10 PM  Result Value  Ref Range   Alcohol, Ethyl (B) <15 <15 mg/dL    Comment: (NOTE) For medical purposes only. Performed at [facility], [address], [place], [place] [address]   CBC     Status: None   Collection Time: 02/14/24  4:43 AM  Result Value Ref Range   WBC 8.3  4.0 - 10.5 K/uL   RBC 5.23 4.22 - 5.81 MIL/uL   Hemoglobin 14.9 13.0 - 17.0 g/dL   HCT 54.9 60.9 - 47.9 %   MCV 86.0 80.0 - 100.0 fL   MCH 28.5 26.0 - 34.0 pg   MCHC 33.1 30.0 - 36.0 g/dL   RDW 86.5 88.4 - 84.4 %   Platelets 162 150 - 400 K/uL   nRBC 0.0 0.0 - 0.2 %    Comment: Performed at [facility], [address] [place], [place] [address]  Comprehensive metabolic panel     Status: Abnormal   Collection Time: 02/14/24  4:43 AM  Result Value Ref Range   Sodium 139 135 - 145 mmol/L   Potassium 4.2 3.5 - 5.1 mmol/L   Chloride 105 98 - 111 mmol/L   CO2 26 22 - 32 mmol/L   Glucose, Bld 114 (H) 70 - 99 mg/dL    Comment: Glucose reference range applies only to samples taken after fasting for at least 8 hours.   BUN 14 8 - 23 mg/dL   Creatinine, Ser 8.86 0.61 - 1.24 mg/dL   Calcium 8.7 (L) 8.9 - 10.3 mg/dL   Total Protein 6.3 (L) 6.5 - 8.1 g/dL   Albumin 3.3 (L) 3.5 - 5.0 g/dL   AST 16 15 - 41 U/L   ALT 29 0 - 44 U/L   Alkaline Phosphatase 64 38 - 126 U/L   Total Bilirubin 1.2 0.0 - 1.2 mg/dL   GFR, Estimated >39 >39 mL/min    Comment: (NOTE) Calculated using the CKD-EPI Creatinine Equation (2021)    Anion gap 8 5 - 15    Comment: Performed at [facility], [address], [place], [place] [address]   US  Abdomen Limited RUQ (LIVER/GB) Result Date: 02/13/2024 EXAM: Right Upper Quadrant Abdominal Ultrasound 02/13/2024 09:16:00 PM TECHNIQUE: Real-time ultrasonography of the right upper quadrant of the abdomen was performed. COMPARISON: Comparison with same day CT abdomen and pelvis. CLINICAL HISTORY: Pancreatitis. FINDINGS: LIVER: Hepatic steatosis with focal fatty sparing along the gallbladder fossa.  Patent portal vein with antegrade flow. No intrahepatic biliary dilation. No evidence of mass. BILIARY SYSTEM: No pericholecystic fluid or wall thickening. No cholelithiasis. Common bile duct measures 2 mm. OTHER: No right upper quadrant ascites. Pancreas is not well visualized due to patient's body habitus and overlying bowel gas. IMPRESSION: 1. Hepatic steatosis. 2. No biliary dilation. 3. Pancreas not well visualized due to body habitus and bowel gas. Electronically signed by: Norman Gatlin MD 02/13/2024 09:44 PM EST RP Workstation: [id number]   CT ABDOMEN PELVIS W CONTRAST Result Date: 02/13/2024 CLINICAL DATA:  elevated lipase, history of pancreatitis, epigastric abd pain. EXAM: CT ABDOMEN AND PELVIS WITH CONTRAST TECHNIQUE: Multidetector CT imaging of the abdomen and pelvis was performed using the standard protocol following bolus administration of intravenous contrast. RADIATION DOSE REDUCTION: This exam was performed according to the departmental dose-optimization program which includes automated exposure control, adjustment of the mA and/or kV according to patient size and/or use of iterative reconstruction technique. CONTRAST:  OMNIPAQUE  IOHEXOL  300 MG/ML  SOLN COMPARISON:  CT scan abdomen and pelvis from 02/22/2023. FINDINGS: Lower chest: There are peripheral/subpleural reticulations with associated bronchiectasis in the visualized bilateral lung bases, concerning for chronic interstitial lung disease. No focal mass, consolidation or pleural effusion. Normal heart size. No pericardial effusion. Hepatobiliary: The liver is normal in size. Non-cirrhotic configuration. No suspicious mass. These is mild diffuse hepatic steatosis. No intrahepatic or extrahepatic bile  duct dilation. The gallbladder is physiologically distended. Small to moderate volume dependent gallstones/sludge noted without imaging signs of acute cholecystitis. Pancreas: There is mild fat stranding predominantly surrounding the  pancreatic body/tail, in appropriate clinical settings, compatible with acute interstitial pancreatitis. There is no associated pancreatic necrosis or peripancreatic collection. The splenic vein is patent. No focal pancreatic lesion. Main pancreatic duct is not dilated. Spleen: Within normal limits. No focal lesion. Adrenals/Urinary Tract: Adrenal glands are unremarkable. No suspicious renal mass. No hydronephrosis. No renal or ureteric calculi. Unremarkable urinary bladder. Stomach/Bowel: There is a small sliding hiatal hernia. No disproportionate dilation of the small or large bowel loops. No evidence of abnormal bowel wall thickening or inflammatory changes. The appendix is unremarkable. There are multiple diverticula mainly in the left hemi colon, without imaging signs of diverticulitis. Vascular/Lymphatic: No ascites or pneumoperitoneum. No abdominal or pelvic lymphadenopathy, by size criteria. No aneurysmal dilation of the major abdominal arteries. There are mild peripheral atherosclerotic vascular calcifications of the aorta and its major branches. Reproductive: Normal size prostate. Symmetric seminal vesicles. Other: There is a tiny fat containing umbilical hernia. The soft tissues and abdominal wall are otherwise unremarkable. Musculoskeletal: No suspicious osseous lesions. There are mild multilevel degenerative changes in the visualized spine. IMPRESSION: 1. Findings compatible with acute interstitial pancreatitis. No associated pancreatic necrosis or peripancreatic collection. 2. Multiple other nonacute observations, as described above. Aortic Atherosclerosis (ICD10-I70.0). Electronically Signed   By: Ree Molt M.D.   On: 02/13/2024 13:56      Assessment/Plan Gallstone pancreatitis The patient has been seen, examined, labs, vitals, chart, and imaging personally reviewed.  He has evidence of gallstones on his CT scan.  He doesn't drink ETOH, triglycerides are normal, and on no medications that  are prone to cause pancreatitis.  He has had a prior episode similar to this.  He needs to his gallbladder removed at this point.  We will plan for lap chole with IOC likely tomorrow given the rapid improvement in his pancreatitis since admission.  We briefly discussed this and he is agreeable to proceed with surgery.   FEN - FLD, NPO p MN VTE - lovenox ID - Rocephin on call to OR  DM HLD  I reviewed hospitalist notes, last 24 h vitals and pain scores, last 48 h intake and output, last 24 h labs and trends, and last 24 h imaging results.  Burnard FORBES Banter, [facility] Surgery 02/14/2024, 12:48 PM Please see Amion for pager number during day hours 7:00am-4:30pm or 7:00am -11:30am on weekends

## 2024-02-14 NOTE — Hospital Course (Addendum)
 68 y.o. male with medical history significant of acute mild pancreatitis 02/2023, interstitial lung disease, hyperlipidemia, type 2 diabetes presenting with progressive abdominal pain with associated nausea and vomiting over the past 1 to 2 days. Found to have acute pancreatitis likely in the setting of symptomatic gallstone disease. Pain resolved completely after fentanyl dose in the ER. ADAT and only oral meds for pain control as needed. Rapidly improved due mild nature of pancreatitis. Plan to get cholecystectomy prior to discharge, hopefully on 12/3. General surgery following.

## 2024-02-14 NOTE — TOC CM/SW Note (Signed)
 Transition of Care [facility]) - Inpatient Brief Assessment   Patient Details  Name: [name] MRN: [id number] Date of Birth: 03-15-1956  Transition of Care [facility]) CM/SW Contact:    Lauraine FORBES Saa, LCSWA Phone Number: 02/14/2024, 9:08 AM   Clinical Narrative:  9:08 AM Per chart review, patient resides at home with spouse and child(ren). Patient has a PCP and insurance. Patient does not have SNF/HH/DME history. Patient's preferred pharmacy is [facility] [phone] [facility]. TOC consult was placed for HH/DME needs. TOC will continue to follow.  Transition of Care Asessment: Insurance and Status: Insurance coverage has been reviewed Patient has primary care physician: Yes Home environment has been reviewed: Private Residence Prior level of function:: N/A Prior/Current Home Services: No current home services Social Drivers of Health Review: SDOH reviewed no interventions necessary Readmission risk has been reviewed: Yes (Currently Observation Status) Transition of care needs: transition of care needs identified, TOC will continue to follow

## 2024-02-14 NOTE — Consult Note (Signed)
 [name] 10-03-1955  [id number].    Requesting MD: Dr. Norval Bar Chief Complaint/Reason for Consult: gallstone pancreatitis  HPI:  This is a pleasant 67 yo with a history of DM and HLD who began having significant epigastric abdominal pain on Saturday.  This progressively worsened.  He had no other symptoms such as N/V/D, chest pain, fevers, SOB, etc.  He has had a previous episode similar to this but not as severe last year.  He presented to the ED yesterday when the pain was so bad he couldn't stand it.  He was found to have pancreatitis.  His CT showed some stones/sludge.  He is already improved today with minimal pain.  We have been asked to see him for consideration of lap chole.  ROS: ROS: see HPI  History reviewed. No pertinent family history.  Past Medical History:  Diagnosis Date   Diabetes mellitus without complication (HCC)    Hyperlipidemia     Past Surgical History:  Procedure Laterality Date   THYROIDECTOMY, PARTIAL      Social History:  reports that he has been smoking cigarettes. He started smoking about 53 years ago. He has a 26.7 pack-year smoking history. He quit smokeless tobacco use about 2 years ago.  His smokeless tobacco use included snuff. He reports that he does not drink alcohol and does not use drugs.  Allergies:  Allergies  Allergen Reactions   Penicillins Other (See Comments)    Per Patient, had a childhood reaction.     Medications Prior to Admission  Medication Sig Dispense Refill   atorvastatin (LIPITOR) 20 MG tablet Take 20 mg by mouth daily.     atorvastatin (LIPITOR) 20 MG tablet Take 1 tablet by mouth daily.     betamethasone dipropionate 0.05 % cream Apply topically daily. (Patient not taking: No sig reported)     metFORMIN (GLUCOPHAGE-XR) 500 MG 24 hr tablet Take 500 mg by mouth every evening.     morphine  (MSIR) 15 MG tablet Take 0.5 tablets (7.5 mg total) by mouth every 4 (four) hours as needed for severe pain (pain  score 7-10). (Patient not taking: Reported on 02/13/2024) 7 tablet 0   ondansetron  (ZOFRAN -ODT) 4 MG disintegrating tablet Take 1 tablet (4 mg total) by mouth every 4 (four) hours as needed for nausea/vomiting 20 tablet 0     Physical Exam: Blood pressure 118/74, pulse (!) 52, temperature 97.9 F (36.6 C), temperature source Oral, resp. rate 18, height 6' (1.829 m), weight 125.2 kg, SpO2 97%. General: pleasant, WD, WN white male who is sitting up in his chair in NAD HEENT: head is normocephalic, atraumatic.  Sclera are noninjected.  PERRL.  Ears and nose without any masses or lesions.  Mouth is pink and moist Heart: regular, rate, and rhythm.  Normal s1,s2. No obvious murmurs, gallops, or rubs noted. Lungs: CTAB, no wheezes, rhonchi, or rales noted.  Respiratory effort nonlabored Abd: soft, minimal epigastric tenderness, but essentially resolved, ND, +BS, no masses.  Small umbilical hernia noted Psych: A&Ox3 with an appropriate affect.   Results for orders placed or performed during the hospital encounter of 02/13/24 (from the past 48 hours)  Lipase, blood     Status: Abnormal   Collection Time: 02/13/24 10:06 AM  Result Value Ref Range   Lipase 790 (H) 11 - 51 U/L    Comment: Performed at [facility], [address] [place], [place] [address]  Comprehensive metabolic panel     Status: Abnormal  Collection Time: 02/13/24 10:06 AM  Result Value Ref Range   Sodium 136 135 - 145 mmol/L   Potassium 4.6 3.5 - 5.1 mmol/L   Chloride 99 98 - 111 mmol/L   CO2 25 22 - 32 mmol/L   Glucose, Bld 127 (H) 70 - 99 mg/dL    Comment: Glucose reference range applies only to samples taken after fasting for at least 8 hours.   BUN 15 8 - 23 mg/dL   Creatinine, Ser 9.00 0.61 - 1.24 mg/dL   Calcium 89.6 8.9 - 89.6 mg/dL   Total Protein 7.9 6.5 - 8.1 g/dL   Albumin 4.4 3.5 - 5.0 g/dL   AST 27 15 - 41 U/L   ALT 45 (H) 0 - 44 U/L   Alkaline Phosphatase 94 38 - 126 U/L   Total  Bilirubin 1.0 0.0 - 1.2 mg/dL   GFR, Estimated >39 >39 mL/min    Comment: (NOTE) Calculated using the CKD-EPI Creatinine Equation (2021)    Anion gap 12 5 - 15    Comment: Performed at [facility], [address], [place], [place] [address]  CBC     Status: Abnormal   Collection Time: 02/13/24 10:06 AM  Result Value Ref Range   WBC 11.2 (H) 4.0 - 10.5 K/uL   RBC 5.83 (H) 4.22 - 5.81 MIL/uL   Hemoglobin 16.6 13.0 - 17.0 g/dL   HCT 50.3 60.9 - 47.9 %   MCV 85.1 80.0 - 100.0 fL   MCH 28.5 26.0 - 34.0 pg   MCHC 33.5 30.0 - 36.0 g/dL   RDW 86.5 88.4 - 84.4 %   Platelets 187 150 - 400 K/uL   nRBC 0.0 0.0 - 0.2 %    Comment: Performed at [facility], [address] [place], [place] [address]  Urinalysis, Routine w reflex microscopic -Urine, Clean Catch     Status: None   Collection Time: 02/13/24 10:06 AM  Result Value Ref Range   Color, Urine YELLOW YELLOW   APPearance CLEAR CLEAR   Specific Gravity, Urine 1.017 1.005 - 1.030   pH 5.5 5.0 - 8.0   Glucose, UA NEGATIVE NEGATIVE mg/dL   Hgb urine dipstick NEGATIVE NEGATIVE   Bilirubin Urine NEGATIVE NEGATIVE   Ketones, ur NEGATIVE NEGATIVE mg/dL   Protein, ur NEGATIVE NEGATIVE mg/dL   Nitrite NEGATIVE NEGATIVE   Leukocytes,Ua NEGATIVE NEGATIVE    Comment: Performed at [facility], [address] [place], [place] [address]  Triglycerides     Status: None   Collection Time: 02/13/24 10:06 AM  Result Value Ref Range   Triglycerides 112 <150 mg/dL    Comment: Performed at [facility], [address], [place], [place] [address]  HIV Antibody (routine testing w rflx)     Status: None   Collection Time: 02/13/24  7:10 PM  Result Value Ref Range   HIV Screen 4th Generation wRfx Non Reactive Non Reactive    Comment: Performed at [facility], [address] [place], [place] [address]  Ethanol     Status: None   Collection Time: 02/13/24  7:10 PM  Result Value  Ref Range   Alcohol, Ethyl (B) <15 <15 mg/dL    Comment: (NOTE) For medical purposes only. Performed at [facility], [address], [place], [place] [address]   CBC     Status: None   Collection Time: 02/14/24  4:43 AM  Result Value Ref Range   WBC 8.3  4.0 - 10.5 K/uL   RBC 5.23 4.22 - 5.81 MIL/uL   Hemoglobin 14.9 13.0 - 17.0 g/dL   HCT 54.9 60.9 - 47.9 %   MCV 86.0 80.0 - 100.0 fL   MCH 28.5 26.0 - 34.0 pg   MCHC 33.1 30.0 - 36.0 g/dL   RDW 86.5 88.4 - 84.4 %   Platelets 162 150 - 400 K/uL   nRBC 0.0 0.0 - 0.2 %    Comment: Performed at [facility], [address] [place], [place] [address]  Comprehensive metabolic panel     Status: Abnormal   Collection Time: 02/14/24  4:43 AM  Result Value Ref Range   Sodium 139 135 - 145 mmol/L   Potassium 4.2 3.5 - 5.1 mmol/L   Chloride 105 98 - 111 mmol/L   CO2 26 22 - 32 mmol/L   Glucose, Bld 114 (H) 70 - 99 mg/dL    Comment: Glucose reference range applies only to samples taken after fasting for at least 8 hours.   BUN 14 8 - 23 mg/dL   Creatinine, Ser 8.86 0.61 - 1.24 mg/dL   Calcium 8.7 (L) 8.9 - 10.3 mg/dL   Total Protein 6.3 (L) 6.5 - 8.1 g/dL   Albumin 3.3 (L) 3.5 - 5.0 g/dL   AST 16 15 - 41 U/L   ALT 29 0 - 44 U/L   Alkaline Phosphatase 64 38 - 126 U/L   Total Bilirubin 1.2 0.0 - 1.2 mg/dL   GFR, Estimated >39 >39 mL/min    Comment: (NOTE) Calculated using the CKD-EPI Creatinine Equation (2021)    Anion gap 8 5 - 15    Comment: Performed at [facility], [address] [place], [place] [address]   US  Abdomen Limited RUQ (LIVER/GB) Result Date: 02/13/2024 EXAM: Right Upper Quadrant Abdominal Ultrasound 02/13/2024 09:16:00 PM TECHNIQUE: Real-time ultrasonography of the right upper quadrant of the abdomen was performed. COMPARISON: Comparison with same day CT abdomen and pelvis. CLINICAL HISTORY: Pancreatitis. FINDINGS: LIVER: Hepatic steatosis with focal fatty sparing along the gallbladder fossa.  Patent portal vein with antegrade flow. No intrahepatic biliary dilation. No evidence of mass. BILIARY SYSTEM: No pericholecystic fluid or wall thickening. No cholelithiasis. Common bile duct measures 2 mm. OTHER: No right upper quadrant ascites. Pancreas is not well visualized due to patient's body habitus and overlying bowel gas. IMPRESSION: 1. Hepatic steatosis. 2. No biliary dilation. 3. Pancreas not well visualized due to body habitus and bowel gas. Electronically signed by: Norman Gatlin MD 02/13/2024 09:44 PM EST RP Workstation: [id number]   CT ABDOMEN PELVIS W CONTRAST Result Date: 02/13/2024 CLINICAL DATA:  elevated lipase, history of pancreatitis, epigastric abd pain. EXAM: CT ABDOMEN AND PELVIS WITH CONTRAST TECHNIQUE: Multidetector CT imaging of the abdomen and pelvis was performed using the standard protocol following bolus administration of intravenous contrast. RADIATION DOSE REDUCTION: This exam was performed according to the departmental dose-optimization program which includes automated exposure control, adjustment of the mA and/or kV according to patient size and/or use of iterative reconstruction technique. CONTRAST:  OMNIPAQUE  IOHEXOL  300 MG/ML  SOLN COMPARISON:  CT scan abdomen and pelvis from 02/22/2023. FINDINGS: Lower chest: There are peripheral/subpleural reticulations with associated bronchiectasis in the visualized bilateral lung bases, concerning for chronic interstitial lung disease. No focal mass, consolidation or pleural effusion. Normal heart size. No pericardial effusion. Hepatobiliary: The liver is normal in size. Non-cirrhotic configuration. No suspicious mass. These is mild diffuse hepatic steatosis. No intrahepatic or extrahepatic bile  duct dilation. The gallbladder is physiologically distended. Small to moderate volume dependent gallstones/sludge noted without imaging signs of acute cholecystitis. Pancreas: There is mild fat stranding predominantly surrounding the  pancreatic body/tail, in appropriate clinical settings, compatible with acute interstitial pancreatitis. There is no associated pancreatic necrosis or peripancreatic collection. The splenic vein is patent. No focal pancreatic lesion. Main pancreatic duct is not dilated. Spleen: Within normal limits. No focal lesion. Adrenals/Urinary Tract: Adrenal glands are unremarkable. No suspicious renal mass. No hydronephrosis. No renal or ureteric calculi. Unremarkable urinary bladder. Stomach/Bowel: There is a small sliding hiatal hernia. No disproportionate dilation of the small or large bowel loops. No evidence of abnormal bowel wall thickening or inflammatory changes. The appendix is unremarkable. There are multiple diverticula mainly in the left hemi colon, without imaging signs of diverticulitis. Vascular/Lymphatic: No ascites or pneumoperitoneum. No abdominal or pelvic lymphadenopathy, by size criteria. No aneurysmal dilation of the major abdominal arteries. There are mild peripheral atherosclerotic vascular calcifications of the aorta and its major branches. Reproductive: Normal size prostate. Symmetric seminal vesicles. Other: There is a tiny fat containing umbilical hernia. The soft tissues and abdominal wall are otherwise unremarkable. Musculoskeletal: No suspicious osseous lesions. There are mild multilevel degenerative changes in the visualized spine. IMPRESSION: 1. Findings compatible with acute interstitial pancreatitis. No associated pancreatic necrosis or peripancreatic collection. 2. Multiple other nonacute observations, as described above. Aortic Atherosclerosis (ICD10-I70.0). Electronically Signed   By: Ree Molt M.D.   On: 02/13/2024 13:56      Assessment/Plan Gallstone pancreatitis The patient has been seen, examined, labs, vitals, chart, and imaging personally reviewed.  He has evidence of gallstones on his CT scan.  He doesn't drink ETOH, triglycerides are normal, and on no medications that  are prone to cause pancreatitis.  He has had a prior episode similar to this.  He needs to his gallbladder removed at this point.  We will plan for lap chole with IOC likely tomorrow given the rapid improvement in his pancreatitis since admission.  We briefly discussed this and he is agreeable to proceed with surgery.   FEN - FLD, NPO p MN VTE - lovenox  ID - Rocephin  on call to OR  DM HLD  I reviewed hospitalist notes, last 24 h vitals and pain scores, last 48 h intake and output, last 24 h labs and trends, and last 24 h imaging results.  Burnard FORBES Banter, [facility] 02/14/2024, 12:48 PM Please see Amion for pager number during day hours 7:00am-4:30pm or 7:00am -11:30am on weekends

## 2024-02-15 ENCOUNTER — Encounter (HOSPITAL_COMMUNITY): Payer: Self-pay | Admitting: Family Medicine

## 2024-02-15 ENCOUNTER — Observation Stay (HOSPITAL_COMMUNITY)

## 2024-02-15 ENCOUNTER — Observation Stay (HOSPITAL_COMMUNITY): Admitting: Anesthesiology

## 2024-02-15 ENCOUNTER — Encounter (HOSPITAL_COMMUNITY): Admission: EM | Disposition: A | Payer: Self-pay | Source: Ambulatory Visit

## 2024-02-15 DIAGNOSIS — K810 Acute cholecystitis: Secondary | ICD-10-CM | POA: Diagnosis present

## 2024-02-15 DIAGNOSIS — J849 Interstitial pulmonary disease, unspecified: Secondary | ICD-10-CM | POA: Diagnosis present

## 2024-02-15 DIAGNOSIS — E785 Hyperlipidemia, unspecified: Secondary | ICD-10-CM | POA: Diagnosis present

## 2024-02-15 DIAGNOSIS — K858 Other acute pancreatitis without necrosis or infection: Secondary | ICD-10-CM | POA: Diagnosis present

## 2024-02-15 DIAGNOSIS — K859 Acute pancreatitis without necrosis or infection, unspecified: Secondary | ICD-10-CM | POA: Diagnosis present

## 2024-02-15 DIAGNOSIS — F17210 Nicotine dependence, cigarettes, uncomplicated: Secondary | ICD-10-CM | POA: Diagnosis present

## 2024-02-15 DIAGNOSIS — E119 Type 2 diabetes mellitus without complications: Secondary | ICD-10-CM | POA: Diagnosis present

## 2024-02-15 DIAGNOSIS — K851 Biliary acute pancreatitis without necrosis or infection: Secondary | ICD-10-CM

## 2024-02-15 DIAGNOSIS — E66812 Obesity, class 2: Secondary | ICD-10-CM | POA: Diagnosis present

## 2024-02-15 HISTORY — PX: INDOCYANINE GREEN FLUORESCENCE IMAGING (ICG): SHX7595

## 2024-02-15 HISTORY — PX: CHOLECYSTECTOMY: SHX55

## 2024-02-15 LAB — GLUCOSE, CAPILLARY
Glucose-Capillary: 104 mg/dL — ABNORMAL HIGH (ref 70–99)
Glucose-Capillary: 137 mg/dL — ABNORMAL HIGH (ref 70–99)

## 2024-02-15 SURGERY — LAPAROSCOPIC CHOLECYSTECTOMY WITH INTRAOPERATIVE CHOLANGIOGRAM
Anesthesia: General | Site: Abdomen

## 2024-02-15 MED ORDER — BUPIVACAINE-EPINEPHRINE (PF) 0.25% -1:200000 IJ SOLN
INTRAMUSCULAR | Status: AC
  Filled 2024-02-15: qty 30

## 2024-02-15 MED ORDER — LIDOCAINE 2% (20 MG/ML) 5 ML SYRINGE
INTRAMUSCULAR | Status: AC
  Filled 2024-02-15: qty 5

## 2024-02-15 MED ORDER — LIDOCAINE 2% (20 MG/ML) 5 ML SYRINGE
INTRAMUSCULAR | Status: DC | PRN
  Administered 2024-02-15: 60 mg via INTRAVENOUS

## 2024-02-15 MED ORDER — PROPOFOL 10 MG/ML IV BOLUS
INTRAVENOUS | Status: DC | PRN
  Administered 2024-02-15: 140 mg via INTRAVENOUS

## 2024-02-15 MED ORDER — CHLORHEXIDINE GLUCONATE 0.12 % MT SOLN
15.0000 mL | Freq: Once | OROMUCOSAL | Status: AC
  Administered 2024-02-15: 15 mL via OROMUCOSAL

## 2024-02-15 MED ORDER — ROCURONIUM BROMIDE 10 MG/ML (PF) SYRINGE
PREFILLED_SYRINGE | INTRAVENOUS | Status: DC | PRN
  Administered 2024-02-15: 60 mg via INTRAVENOUS

## 2024-02-15 MED ORDER — ORAL CARE MOUTH RINSE: 15.0000 mL | Freq: Once | OROMUCOSAL | Status: AC

## 2024-02-15 MED ORDER — 0.9 % SODIUM CHLORIDE (POUR BTL) OPTIME
TOPICAL | Status: DC | PRN
  Administered 2024-02-15: 1000 mL

## 2024-02-15 MED ORDER — SUGAMMADEX SODIUM 200 MG/2ML IV SOLN
INTRAVENOUS | Status: DC | PRN
  Administered 2024-02-15: 200 mg via INTRAVENOUS

## 2024-02-15 MED ORDER — SODIUM CHLORIDE 0.9 % IV SOLN
INTRAVENOUS | Status: AC
  Filled 2024-02-15: qty 20

## 2024-02-15 MED ORDER — ROCURONIUM BROMIDE 10 MG/ML (PF) SYRINGE
PREFILLED_SYRINGE | INTRAVENOUS | Status: AC
  Filled 2024-02-15: qty 10

## 2024-02-15 MED ORDER — OXYCODONE HCL 5 MG PO TABS: 5.0000 mg | ORAL_TABLET | Freq: Once | ORAL | Status: DC | PRN

## 2024-02-15 MED ORDER — PROPOFOL 10 MG/ML IV BOLUS
INTRAVENOUS | Status: AC
  Filled 2024-02-15: qty 20

## 2024-02-15 MED ORDER — ONDANSETRON HCL 4 MG/2ML IJ SOLN
INTRAMUSCULAR | Status: AC
  Filled 2024-02-15: qty 2

## 2024-02-15 MED ORDER — ACETAMINOPHEN 500 MG PO TABS
1000.0000 mg | ORAL_TABLET | Freq: Four times a day (QID) | ORAL | Status: DC
  Administered 2024-02-15 – 2024-02-16 (×3): 1000 mg via ORAL
  Filled 2024-02-15 (×4): qty 2

## 2024-02-15 MED ORDER — ACETAMINOPHEN 500 MG PO TABS: 1000.0000 mg | ORAL_TABLET | Freq: Once | ORAL | Status: AC

## 2024-02-15 MED ORDER — ONDANSETRON HCL 4 MG/2ML IJ SOLN: 4.0000 mg | Freq: Once | INTRAMUSCULAR | Status: DC | PRN

## 2024-02-15 MED ORDER — DEXAMETHASONE SOD PHOSPHATE PF 10 MG/ML IJ SOLN
INTRAMUSCULAR | Status: DC | PRN
  Administered 2024-02-15: 10 mg via INTRAVENOUS

## 2024-02-15 MED ORDER — INSULIN ASPART 100 UNIT/ML IJ SOLN: 0.0000 [IU] | INTRAMUSCULAR | Status: DC | PRN

## 2024-02-15 MED ORDER — BUPIVACAINE-EPINEPHRINE 0.25% -1:200000 IJ SOLN
INTRAMUSCULAR | Status: DC | PRN
  Administered 2024-02-15: 10 mL

## 2024-02-15 MED ORDER — ONDANSETRON HCL 4 MG/2ML IJ SOLN
INTRAMUSCULAR | Status: DC | PRN
  Administered 2024-02-15: 4 mg via INTRAVENOUS

## 2024-02-15 MED ORDER — FENTANYL CITRATE (PF) 250 MCG/5ML IJ SOLN
INTRAMUSCULAR | Status: DC | PRN
  Administered 2024-02-15 (×4): 50 ug via INTRAVENOUS

## 2024-02-15 MED ORDER — HEMOSTATIC AGENTS (NO CHARGE) OPTIME
TOPICAL | Status: DC | PRN
  Administered 2024-02-15: 1 via TOPICAL

## 2024-02-15 MED ORDER — SODIUM CHLORIDE 0.9 % IV SOLN
INTRAVENOUS | Status: DC | PRN
  Administered 2024-02-15: 10 mL

## 2024-02-15 MED ORDER — OXYCODONE HCL 5 MG/5ML PO SOLN: 5.0000 mg | Freq: Once | ORAL | Status: DC | PRN

## 2024-02-15 MED ORDER — FENTANYL CITRATE (PF) 100 MCG/2ML IJ SOLN: 25.0000 ug | INTRAMUSCULAR | Status: DC | PRN

## 2024-02-15 MED ORDER — OXYCODONE HCL 5 MG PO TABS
5.0000 mg | ORAL_TABLET | ORAL | Status: DC | PRN
  Administered 2024-02-15: 10 mg via ORAL
  Filled 2024-02-15: qty 2

## 2024-02-15 MED ORDER — LACTATED RINGERS IV SOLN: INTRAVENOUS | Status: DC

## 2024-02-15 MED ORDER — SODIUM CHLORIDE 0.9 % IR SOLN
Status: DC | PRN
  Administered 2024-02-15: 1000 mL

## 2024-02-15 MED ORDER — FENTANYL CITRATE (PF) 250 MCG/5ML IJ SOLN
INTRAMUSCULAR | Status: AC
  Filled 2024-02-15: qty 5

## 2024-02-15 MED ORDER — ACETAMINOPHEN 500 MG PO TABS
ORAL_TABLET | ORAL | Status: AC
  Administered 2024-02-15: 1000 mg via ORAL
  Filled 2024-02-15: qty 2

## 2024-02-15 SURGICAL SUPPLY — 36 items
BAG COUNTER SPONGE SURGICOUNT (BAG) ×1 IMPLANT
BLADE CLIPPER SURG (BLADE) IMPLANT
CANISTER SUCTION 3000ML PPV (SUCTIONS) ×1 IMPLANT
CHLORAPREP W/TINT 26 (MISCELLANEOUS) ×1 IMPLANT
CLIP APPLIE ROT 10 11.4 M/L (STAPLE) ×1 IMPLANT
COVER MAYO STAND STRL (DRAPES) ×1 IMPLANT
COVER SURGICAL LIGHT HANDLE (MISCELLANEOUS) ×1 IMPLANT
DERMABOND ADVANCED .7 DNX12 (GAUZE/BANDAGES/DRESSINGS) ×1 IMPLANT
DRAPE C-ARM 42X120 X-RAY (DRAPES) ×1 IMPLANT
ELECTRODE REM PT RTRN 9FT ADLT (ELECTROSURGICAL) ×1 IMPLANT
GLOVE BIO SURGEON STRL SZ8 (GLOVE) ×1 IMPLANT
GLOVE BIOGEL PI IND STRL 8 (GLOVE) ×1 IMPLANT
GOWN STRL REUS W/ TWL LRG LVL3 (GOWN DISPOSABLE) ×2 IMPLANT
GOWN STRL REUS W/ TWL XL LVL3 (GOWN DISPOSABLE) ×1 IMPLANT
HEMOSTAT SNOW SURGICEL 2X4 (HEMOSTASIS) IMPLANT
IRRIGATION SUCT STRKRFLW 2 WTP (MISCELLANEOUS) ×1 IMPLANT
KIT BASIN OR (CUSTOM PROCEDURE TRAY) ×1 IMPLANT
KIT IMAGING PINPOINTPAQ (MISCELLANEOUS) IMPLANT
KIT TURNOVER KIT B (KITS) ×1 IMPLANT
PAD ARMBOARD POSITIONER FOAM (MISCELLANEOUS) ×1 IMPLANT
POUCH RETRIEVAL ECOSAC 10 (ENDOMECHANICALS) ×1 IMPLANT
SCISSORS LAP 5X35 DISP (ENDOMECHANICALS) ×1 IMPLANT
SET CHOLANGIOGRAPH 5 50 .035 (SET/KITS/TRAYS/PACK) ×1 IMPLANT
SET TUBE SMOKE EVAC HIGH FLOW (TUBING) ×1 IMPLANT
SLEEVE Z-THREAD 5X100MM (TROCAR) ×1 IMPLANT
SOLN 0.9% NACL POUR BTL 1000ML (IV SOLUTION) ×1 IMPLANT
SOLN STERILE WATER BTL 1000 ML (IV SOLUTION) ×1 IMPLANT
SUT MNCRL AB 4-0 PS2 18 (SUTURE) ×1 IMPLANT
SUT VICRYL 0 UR6 27IN ABS (SUTURE) IMPLANT
TOWEL GREEN STERILE (TOWEL DISPOSABLE) ×1 IMPLANT
TOWEL GREEN STERILE FF (TOWEL DISPOSABLE) ×1 IMPLANT
TRAY LAPAROSCOPIC MC (CUSTOM PROCEDURE TRAY) ×1 IMPLANT
TROCAR 11X100 Z THREAD (TROCAR) ×1 IMPLANT
TROCAR BALLN 12MMX100 BLUNT (TROCAR) ×1 IMPLANT
TROCAR Z-THREAD OPTICAL 5X100M (TROCAR) ×1 IMPLANT
WARMER LAPAROSCOPE (MISCELLANEOUS) ×1 IMPLANT

## 2024-02-15 NOTE — Anesthesia Postprocedure Evaluation (Signed)
 Anesthesia Post Note  Patient: [name]  Procedure(s) Performed: DG CHOLANGIOGRAM OPERATIVE LAPAROSCOPIC CHOLECYSTECTOMY WITH INTRAOPERATIVE CHOLANGIOGRAM (Abdomen) INDOCYANINE GREEN FLUORESCENCE IMAGING (ICG) (Abdomen)     Patient location during evaluation: PACU Anesthesia Type: General Level of consciousness: awake and alert Pain management: pain level controlled Vital Signs Assessment: post-procedure vital signs reviewed and stable Respiratory status: spontaneous breathing, nonlabored ventilation and respiratory function stable Cardiovascular status: stable and blood pressure returned to baseline Anesthetic complications: no   No notable events documented.  Last Vitals:  Vitals:   02/15/24 1245 02/15/24 1309  BP: 139/75 126/68  Pulse: (!) 57 (!) 55  Resp: 16 16  Temp: 36.6 C 36.6 C  SpO2: 93% 93%                    Debby FORBES Like

## 2024-02-15 NOTE — Discharge Instructions (Signed)

## 2024-02-15 NOTE — Progress Notes (Signed)
    PROCEDURAL EXPEDITER PROGRESS NOTE  Patient Name: [name]  DOB:12-Nov-1955 Date of Admission: 02/13/2024  Date of Assessment:02/15/24   -------------------------------------------------------------------------------------------------------------------   Brief clinical summary: 68 yr old male with Hx of DM and current smoker .  Pt scheduled for surgery on 02/15/2024 Lap cholecystectomy with Intra-op cholangiogram   Orders in place:  Yes   Communication with surgical team if no orders: n/a  Labs, test, and orders reviewed: yes  Requires surgical clearance:  No  What type of clearance: n/a  Clearance received: n/a  Barriers noted:n/a   Intervention provided by [facility] team: n/a  Barrier resolved:  not applicable   -------------------------------------------------------------------------------------------------------------------  [name], [name] Please contact us  directly via secure chat (search for [facility]) or by calling us  at [phone] [facility]).

## 2024-02-15 NOTE — Anesthesia Procedure Notes (Signed)
 Procedure Name: Intubation Date/Time: 02/15/2024 10:46 AM  Performed by: Lylia Schuyler BROCKS, CRNAPre-anesthesia Checklist: Patient identified, Emergency Drugs available, Suction available and Patient being monitored Patient Re-evaluated:Patient Re-evaluated prior to induction Oxygen Delivery Method: Circle system utilized Preoxygenation: Pre-oxygenation with 100% oxygen Induction Type: IV induction Ventilation: Mask ventilation without difficulty Laryngoscope Size: Mac and 4 Grade View: Grade I Tube type: Oral Tube size: 7.5 mm Number of attempts: 1 Airway Equipment and Method: Stylet and Oral airway Placement Confirmation: ETT inserted through vocal cords under direct vision, positive ETCO2 and breath sounds checked- equal and bilateral Secured at: 22 cm Tube secured with: Tape Dental Injury: Teeth and Oropharynx as per pre-operative assessment

## 2024-02-15 NOTE — Transfer of Care (Signed)
 Immediate Anesthesia Transfer of Care Note  Patient: MARCELLIUS MONTAGNA  Procedure(s) Performed: DG CHOLANGIOGRAM OPERATIVE LAPAROSCOPIC CHOLECYSTECTOMY WITH INTRAOPERATIVE CHOLANGIOGRAM (Abdomen) INDOCYANINE GREEN FLUORESCENCE IMAGING (ICG) (Abdomen)  Patient Location: PACU  Anesthesia Type:General  Level of Consciousness: drowsy and patient cooperative  Airway & Oxygen Therapy: Patient Spontanous Breathing and Patient connected to face mask oxygen  Post-op Assessment: Report given to RN, Post -op Vital signs reviewed and stable, and Patient moving all extremities  Post vital signs: Reviewed and stable  Last Vitals:  Vitals Value Taken Time  BP 171/91 02/15/24 12:06  Temp 36.6 C 02/15/24 12:04  Pulse 51 02/15/24 12:14  Resp 21 02/15/24 12:14  SpO2 99 % 02/15/24 12:14  Vitals shown include unfiled device data.  Last Pain:  Vitals:   02/15/24 1204  TempSrc:   PainSc: Asleep         Complications: No notable events documented.

## 2024-02-15 NOTE — Interval H&P Note (Signed)
 History and Physical Interval Note:  02/15/2024 9:55 AM  [name]  has presented today for surgery, with the diagnosis of Gallstone Pancreatitis.  The various methods of treatment have been discussed with the patient and family. After consideration of risks, benefits and other options for treatment, the patient has consented to  Procedure(s): LAPAROSCOPIC CHOLECYSTECTOMY WITH INTRAOPERATIVE CHOLANGIOGRAM (N/A) INDOCYANINE GREEN FLUORESCENCE IMAGING (ICG) (N/A) as a surgical intervention.  The patient's history has been reviewed, patient examined, no change in status, stable for surgery.  I have reviewed the patient's chart and labs.  Questions were answered to the patient's satisfaction.   The procedure has been discussed with the patient. Operative and non operative treatments have been discussed. Risks of surgery include bleeding, infection,  Common bile duct injury,  Injury to the stomach,liver, colon,small intestine, abdominal wall,  Diaphragm,  Major blood vessels,  And the need for an open procedure.  Other risks include worsening of medical problems, death,  DVT and pulmonary embolism, and cardiovascular events.   Medical options have also been discussed. The patient has been informed of long term expectations of surgery and non surgical options,  The patient agrees to proceed.     Newt Levingston A Kalinda Romaniello

## 2024-02-15 NOTE — Plan of Care (Signed)

## 2024-02-15 NOTE — Op Note (Signed)
 Laparoscopic Cholecystectomy with IOC Procedure Note  Indications: This patient presents with symptomatic gallbladder disease gallstone pancreatitis  and will undergo laparoscopic cholecystectomy.The procedure has been discussed with the patient. Operative and non operative treatments have been discussed. Risks of surgery include bleeding, infection,  Common bile duct injury,  Injury to the stomach,liver, colon,small intestine, abdominal wall,  Diaphragm,  Major blood vessels,  And the need for an open procedure.  Other risks include worsening of medical problems, death,  DVT and pulmonary embolism, and cardiovascular events.   Medical options have also been discussed. The patient has been informed of long term expectations of surgery and non surgical options,  The patient agrees to proceed.     Pre-operative Diagnosis: gallstone pancreatitis   Post-operative Diagnosis: Same  Surgeon: Adrian DELENA Shipper  MD   Assistants: OR Staff  Anesthesia: General endotracheal anesthesia and Local anesthesia 0.25.% bupivacaine, with epinephrine  ASA Class: 2  Procedure Details  The patient was seen again in the Holding Room. The risks, benefits, complications, treatment options, and expected outcomes were discussed with the patient. The possibilities of reaction to medication, pulmonary aspiration, perforation of viscus, bleeding, recurrent infection, finding a normal gallbladder, the need for additional procedures, failure to diagnose a condition, the possible need to convert to an open procedure, and creating a complication requiring transfusion or operation were discussed with the patient. The patient and/or family concurred with the proposed plan, giving informed consent. The site of surgery properly noted/marked. The patient was taken to Operating Room, identified as [name] and the procedure verified as Laparoscopic Cholecystectomy with Intraoperative Cholangiograms. A Time Out was held and the above  information confirmed.  Prior to the induction of general anesthesia, antibiotic prophylaxis was administered. General endotracheal anesthesia was then administered and tolerated well. After the induction, the abdomen was prepped in the usual sterile fashion. The patient was positioned in the supine position with the left arm comfortably tucked, along with some reverse Trendelenburg.  Local anesthetic agent was injected into the skin near the umbilicus and an incision made. The midline fascia was incised and the Hasson technique was used to introduce a 12 mm port under direct vision. It was secured with a figure of eight Vicryl suture placed in the usual fashion. Pneumoperitoneum was then created with CO2 and tolerated well without any adverse changes in the patient's vital signs. Additional trocars were introduced under direct vision with an 11 mm trocar in the epigastrium and 2 5 mm trocars in the right upper quadrant. All skin incisions were infiltrated with a local anesthetic agent before making the incision and placing the trocars.   The gallbladder was identified, the fundus grasped and retracted cephalad. Adhesions were lysed bluntly and with the electrocautery where indicated, taking care not to injure any adjacent organs or viscus. The infundibulum was grasped and retracted laterally, exposing the peritoneum overlying the triangle of Calot. This was then divided and exposed in a blunt fashion. The cystic duct was clearly identified and bluntly dissected circumferentially. The junctions of the gallbladder, cystic duct and common bile duct were clearly identified prior to the division of any linear structure.   An incision was made in the cystic duct and the cholangiogram catheter introduced. The catheter was secured using an endoclip. The study showed no stones and good visualization of the distal and proximal biliary tree. The catheter was then removed.   The cystic duct was then  ligated with  surgical clips  on the patient side  and  clipped on the gallbladder side and divided. The cystic artery was identified, dissected free, ligated with clips and divided as well. Posterior cystic artery clipped and divided.  The gallbladder was dissected from the liver bed in retrograde fashion with the electrocautery. The gallbladder was removed and placed into a pouch. . The liver bed was irrigated and inspected. Hemostasis was achieved with the electrocautery. Copious irrigation was utilized and was repeatedly aspirated until clear all particulate matter. Hemostasis was achieved with no signs  Of bleeding or bile leakage. The gallbladder was extracted through the umbilical port site.   Pneumoperitoneum was completely reduced after viewing removal of the trocars under direct vision. The wound was thoroughly irrigated and the fascia was then closed with a figure of eight suture; the skin was then closed with 4 0 monocryl and a sterile dressing of dermabond was applied.  Instrument, sponge, and needle counts were correct at closure and at the conclusion of the case.   Findings: Cholecystitis    Estimated Blood Loss: less than 50 mL         Drains: none         Total IV Fluids: per OR record          Specimens: Gallbladder           Complications: None; patient tolerated the procedure well.         Disposition: PACU - hemodynamically stable.         Condition: stable

## 2024-02-15 NOTE — Plan of Care (Signed)
  Problem: Education: Goal: Knowledge of General Education information will improve Description: Including pain rating scale, medication(s)/side effects and non-pharmacologic comfort measures Outcome: Progressing   Problem: Health Behavior/Discharge Planning: Goal: Ability to manage health-related needs will improve Outcome: Progressing   Problem: Clinical Measurements: Goal: Ability to maintain clinical measurements within normal limits will improve Outcome: Progressing Goal: Will remain free from infection Outcome: Progressing   Problem: Activity: Goal: Risk for activity intolerance will decrease Outcome: Progressing   Problem: Nutrition: Goal: Adequate nutrition will be maintained Outcome: Progressing   Problem: Coping: Goal: Level of anxiety will decrease Outcome: Progressing   Problem: Elimination: Goal: Will not experience complications related to bowel motility Outcome: Progressing Goal: Will not experience complications related to urinary retention Outcome: Progressing   Problem: Safety: Goal: Ability to remain free from injury will improve Outcome: Progressing   Problem: Skin Integrity: Goal: Risk for impaired skin integrity will decrease Outcome: Progressing

## 2024-02-15 NOTE — Progress Notes (Signed)
 PROGRESS NOTE    [name]  FMW:[id number] DOB: 1956/03/12 DOA: 02/13/2024 PCP: [facility] @ [facility] Course 68 y.o. male with medical history significant of acute mild pancreatitis 02/2023, interstitial lung disease, hyperlipidemia, type 2 diabetes presenting with progressive abdominal pain with associated nausea and vomiting over the past 1 to 2 days. Found to have acute pancreatitis.    Today, saw patient prior to surgery, denied any abdominal pain, nausea/vomiting, fever/chills.    Assessment and Plan: Acute recurrent mild pancreatitis Similar presentation 02/2023 with outpatient GI follow up and fairly stable workup. Lipase in 700s Triglyceride not elevated, ETOH level low LFTs WNL  CT scan concerning for acute interstitial pancreatitis, biliary sludge and mild GB distension  RUQ u/s showed no cholelithiasis, and normal CBD General Surgery on board, s/p cholecystectomy and IOC on 12/3, no stones noted Monitor postsurgery  T2DM (type 2 diabetes mellitus) (HCC) No recent A1c, pending in a.m. 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.9) BMI 37.4    DVT prophylaxis: enoxaparin (LOVENOX) injection 40 mg Start: 02/13/24 1915  Lovenox   Code Status: Full Code Family Communication: updated wife and the children at bedside Disposition Plan: Home  Reason for continuing need for hospitalization: Level of care   Objective: Vitals:   02/15/24 1215 02/15/24 1230 02/15/24 1245 02/15/24 1309  BP: (!) 156/75 132/80 139/75 126/68  Pulse: (!) 51 (!) 54 (!) 57 (!) 55  Resp: 16 16 16 16   Temp:   97.8 F (36.6 C) 97.9 F (36.6 C)  TempSrc:    Oral  SpO2: 99% 94% 93% 93%  Weight:      Height:        Intake/Output Summary (Last 24 hours) at 02/15/2024  1431 Last data filed at 02/15/2024 1048 Gross per 24 hour  Intake 100 ml  Output --  Net 100 ml   Filed Weights   02/13/24 1004 02/15/24 1004  Weight: 125.2 kg 124.7 kg    Examination:  General: NAD  Cardiovascular: S1, S2 present Respiratory: CTAB Abdomen: Soft, nontender, nondistended, bowel sounds present Musculoskeletal: No bilateral pedal edema noted Skin: Normal Psychiatry: Normal mood  Data Reviewed: I have personally reviewed following labs and imaging studies  CBC: Recent Labs  Lab 02/13/24 1006 02/14/24 0443  WBC 11.2* 8.3  HGB 16.6 14.9  HCT 49.6 45.0  MCV 85.1 86.0  PLT 187 162   Basic Metabolic Panel: Recent Labs  Lab 02/13/24 1006 02/14/24 0443  NA 136 139  K 4.6 4.2  CL 99 105  CO2 25 26  GLUCOSE 127* 114*  BUN 15 14  CREATININE 0.99 1.13  CALCIUM 10.3 8.7*   GFR: Estimated Creatinine Clearance: 82.9 mL/min (by C-G formula based on SCr of 1.13 mg/dL). Liver Function Tests: Recent Labs  Lab 02/13/24 1006 02/14/24 0443  AST 27 16  ALT 45* 29  ALKPHOS 94 64  BILITOT 1.0 1.2  PROT 7.9 6.3*  ALBUMIN 4.4 3.3*   Recent Labs  Lab 02/13/24 1006  LIPASE 790*   No results for input(s): AMMONIA in the last 168 hours. Coagulation Profile: No results for input(s): INR, PROTIME in the last 168 hours. Cardiac Enzymes: No results for input(s): CKTOTAL, CKMB, CKMBINDEX, TROPONINI in the last 168 hours. ProBNP, BNP (last 5 results) No results for input(s): PROBNP, BNP  in the last 8760 hours. HbA1C: No results for input(s): HGBA1C in the last 72 hours. CBG: Recent Labs  Lab 02/15/24 1012 02/15/24 1213  GLUCAP 104* 137*   Lipid Profile: Recent Labs    02/13/24 1006  TRIG 112   Thyroid  Function Tests: No results for input(s): TSH, T4TOTAL, FREET4, T3FREE, THYROIDAB in the last 72 hours. Anemia Panel: No results for input(s): VITAMINB12, FOLATE, FERRITIN, TIBC, IRON, RETICCTPCT in the last 72  hours. Sepsis Labs: No results for input(s): PROCALCITON, LATICACIDVEN in the last 168 hours.  No results found for this or any previous visit (from the past 240 hours).   Radiology Studies: US  Abdomen Limited RUQ (LIVER/GB) Result Date: 02/13/2024 EXAM: Right Upper Quadrant Abdominal Ultrasound 02/13/2024 09:16:00 PM TECHNIQUE: Real-time ultrasonography of the right upper quadrant of the abdomen was performed. COMPARISON: Comparison with same day CT abdomen and pelvis. CLINICAL HISTORY: Pancreatitis. FINDINGS: LIVER: Hepatic steatosis with focal fatty sparing along the gallbladder fossa. Patent portal vein with antegrade flow. No intrahepatic biliary dilation. No evidence of mass. BILIARY SYSTEM: No pericholecystic fluid or wall thickening. No cholelithiasis. Common bile duct measures 2 mm. OTHER: No right upper quadrant ascites. Pancreas is not well visualized due to patient's body habitus and overlying bowel gas. IMPRESSION: 1. Hepatic steatosis. 2. No biliary dilation. 3. Pancreas not well visualized due to body habitus and bowel gas. Electronically signed by: Norman Gatlin MD 02/13/2024 09:44 PM EST RP Workstation: [id number]    Scheduled Meds:  acetaminophen  1,000 mg Oral Q6H   enoxaparin (LOVENOX) injection  40 mg Subcutaneous Q24H   Continuous Infusions:     LOS: 0 days   Lebron JINNY Cage, MD  [facility]  02/15/2024, 2:31 PM

## 2024-02-15 NOTE — Anesthesia Preprocedure Evaluation (Addendum)
 Anesthesia Evaluation  Patient identified by MRN, date of birth, ID band Patient awake    Reviewed: Allergy & Precautions, NPO status , Patient's Chart, lab work & pertinent test results  History of Anesthesia Complications Negative for: history of anesthetic complications  Airway Mallampati: III  TM Distance: >3 FB Neck ROM: Full    Dental  (+) Dental Advisory Given, Teeth Intact   Pulmonary Current Smoker and Patient abstained from smoking.   Pulmonary exam normal        Cardiovascular negative cardio ROS Normal cardiovascular exam     Neuro/Psych negative neurological ROS  negative psych ROS   GI/Hepatic negative GI ROS, Neg liver ROS,,,  Endo/Other  diabetes, Type 2, Oral Hypoglycemic Agents   Obesity   Renal/GU negative Renal ROS     Musculoskeletal negative musculoskeletal ROS (+)    Abdominal  (+) + obese  Peds  Hematology negative hematology ROS (+)   Anesthesia Other Findings   Reproductive/Obstetrics                              Anesthesia Physical Anesthesia Plan  ASA: 2  Anesthesia Plan: General   Post-op Pain Management: Tylenol PO (pre-op)*   Induction: Intravenous  PONV Risk Score and Plan: 2 and Treatment may vary due to age or medical condition, Ondansetron  and Dexamethasone  Airway Management Planned: Oral ETT  Additional Equipment: None  Intra-op Plan:   Post-operative Plan: Extubation in OR  Informed Consent: I have reviewed the patients History and Physical, chart, labs and discussed the procedure including the risks, benefits and alternatives for the proposed anesthesia with the patient or authorized representative who has indicated his/her understanding and acceptance.     Dental advisory given  Plan Discussed with: CRNA and Anesthesiologist  Anesthesia Plan Comments:          Anesthesia Quick Evaluation

## 2024-02-16 ENCOUNTER — Encounter (HOSPITAL_COMMUNITY): Payer: Self-pay | Admitting: Surgery

## 2024-02-16 ENCOUNTER — Ambulatory Visit: Payer: Self-pay | Admitting: Surgery

## 2024-02-16 DIAGNOSIS — K859 Acute pancreatitis without necrosis or infection, unspecified: Secondary | ICD-10-CM | POA: Diagnosis not present

## 2024-02-16 LAB — COMPREHENSIVE METABOLIC PANEL WITH GFR
ALT: 65 U/L — ABNORMAL HIGH (ref 0–44)
AST: 43 U/L — ABNORMAL HIGH (ref 15–41)
Albumin: 3.1 g/dL — ABNORMAL LOW (ref 3.5–5.0)
Alkaline Phosphatase: 63 U/L (ref 38–126)
Anion gap: 9 (ref 5–15)
BUN: 20 mg/dL (ref 8–23)
CO2: 25 mmol/L (ref 22–32)
Calcium: 8.6 mg/dL — ABNORMAL LOW (ref 8.9–10.3)
Chloride: 104 mmol/L (ref 98–111)
Creatinine, Ser: 1.22 mg/dL (ref 0.61–1.24)
GFR, Estimated: >60 mL/min (ref >60)
Glucose, Bld: 130 mg/dL — ABNORMAL HIGH (ref 70–99)
Potassium: 4.3 mmol/L (ref 3.5–5.1)
Sodium: 138 mmol/L (ref 135–145)
Total Bilirubin: 0.8 mg/dL (ref 0.0–1.2)
Total Protein: 6.2 g/dL — ABNORMAL LOW (ref 6.5–8.1)

## 2024-02-16 LAB — CBC WITH DIFFERENTIAL/PLATELET
Abs Immature Granulocytes: 0.07 K/uL (ref 0.00–0.07)
Basophils Absolute: 0 K/uL (ref 0.0–0.1)
Basophils Relative: 0 %
Eosinophils Absolute: 0.1 K/uL (ref 0.0–0.5)
Eosinophils Relative: 1 %
HCT: 42.7 % (ref 39.0–52.0)
Hemoglobin: 14.4 g/dL (ref 13.0–17.0)
Immature Granulocytes: 1 %
Lymphocytes Relative: 18 %
Lymphs Abs: 2.3 K/uL (ref 0.7–4.0)
MCH: 28.6 pg (ref 26.0–34.0)
MCHC: 33.7 g/dL (ref 30.0–36.0)
MCV: 84.7 fL (ref 80.0–100.0)
Monocytes Absolute: 1 K/uL (ref 0.1–1.0)
Monocytes Relative: 8 %
Neutro Abs: 9.3 K/uL — ABNORMAL HIGH (ref 1.7–7.7)
Neutrophils Relative %: 72 %
Platelets: 190 K/uL (ref 150–400)
RBC: 5.04 MIL/uL (ref 4.22–5.81)
RDW: 12.8 % (ref 11.5–15.5)
WBC: 12.8 K/uL — ABNORMAL HIGH (ref 4.0–10.5)
nRBC: 0 % (ref 0.0–0.2)

## 2024-02-16 LAB — SURGICAL PATHOLOGY

## 2024-02-16 LAB — HEMOGLOBIN A1C
Hgb A1c MFr Bld: 6.3 % — ABNORMAL HIGH (ref 4.8–5.6)
Mean Plasma Glucose: 134 mg/dL

## 2024-02-16 MED ORDER — ACETAMINOPHEN 500 MG PO TABS: 1000.0000 mg | ORAL_TABLET | Freq: Four times a day (QID) | ORAL | Status: DC | PRN

## 2024-02-16 MED ORDER — ACETAMINOPHEN 500 MG PO TABS: 1000.0000 mg | ORAL_TABLET | Freq: Four times a day (QID) | ORAL | Status: AC | PRN

## 2024-02-16 NOTE — Plan of Care (Signed)
 Patient calm and cooperative A&O X4, wife at bedside. Patient ambulated to bathroom. Surgical incisions intact with surgical adhesive maintained. Pain managed well. Patient left with call bell in reach and bed in lowest position.  Problem: Education: Goal: Knowledge of General Education information will improve Description: Including pain rating scale, medication(s)/side effects and non-pharmacologic comfort measures Outcome: Progressing   Problem: Clinical Measurements: Goal: Ability to maintain clinical measurements within normal limits will improve Outcome: Progressing   Problem: Activity: Goal: Risk for activity intolerance will decrease Outcome: Progressing   Problem: Nutrition: Goal: Adequate nutrition will be maintained Outcome: Progressing   Problem: Coping: Goal: Level of anxiety will decrease Outcome: Progressing   Problem: Elimination: Goal: Will not experience complications related to bowel motility Outcome: Progressing   Problem: Pain Managment: Goal: General experience of comfort will improve and/or be controlled Outcome: Progressing   Problem: Safety: Goal: Ability to remain free from injury will improve Outcome: Progressing

## 2024-02-16 NOTE — TOC Transition Note (Signed)
 Transition of Care [facility]) - Discharge Note   Patient Details  Name: [name] MRN: [id number] Date of Birth: 1955-10-11  Transition of Care [facility]) CM/SW Contact:  [name], RN Phone Number: 02/16/2024, 9:50 AM   Clinical Narrative:    [name] is stable to discharge home. Follow up apt on [facility]. No ICM (Inpatient Care Management) needs at this time.     Final next level of care: Home/Self Care Barriers to Discharge: Barriers Resolved   Patient Goals and CMS Choice Patient states their goals for this hospitalization and ongoing recovery are:: return home          Discharge Placement          home             Discharge Plan and Services Additional resources added to the After Visit Summary for                                       Social Drivers of Health (SDOH) Interventions SDOH Screenings   Food Insecurity: No Food Insecurity (02/13/2024)  Housing: Low Risk  (02/13/2024)  Transportation Needs: No Transportation Needs (02/13/2024)  Utilities: Not At Risk (02/13/2024)  Depression (PHQ2-9): Low Risk  (07/27/2019)  Social Connections: Moderately Isolated (02/13/2024)  Tobacco Use: High Risk (02/15/2024)     Readmission Risk Interventions    02/16/2024    9:50 AM  Readmission Risk Prevention Plan  Post Dischage Appt Complete  Medication Screening Complete  Transportation Screening Complete

## 2024-02-16 NOTE — Progress Notes (Signed)
 1 Day Post-Op  Subjective: Patient feels great today.  Minimal pain controlled with tylenol .  No nausea.  Eating a solid diet with no issues.  Mobilizing and voiding.  ROS: See above, otherwise other systems negative  Objective: Vital signs in last 24 hours: Temp:  [97.8 F (36.6 C)-98.8 F (37.1 C)] 98.4 F (36.9 C) (12/04 0821) Pulse Rate:  [45-62] 45 (12/04 0821) Resp:  [16-24] 18 (12/04 0821) BP: (101-171)/(64-91) 112/71 (12/04 0821) SpO2:  [93 %-99 %] 99 % (12/04 0821) Weight:  [124.7 kg] 124.7 kg (12/03 1004) Last BM Date : 02/15/24  Intake/Output from previous day: 12/03 0701 - 12/04 0700 In: 460 [P.O.:360; IV Piggyback:100] Out: -  Intake/Output this shift: No intake/output data recorded.  PE: Abd: soft, minimally tender, incisions c/d/i  Lab Results:  Recent Labs    02/14/24 0443 02/16/24 0445  WBC 8.3 12.8*  HGB 14.9 14.4  HCT 45.0 42.7  PLT 162 190   BMET Recent Labs    02/14/24 0443 02/16/24 0445  NA 139 138  K 4.2 4.3  CL 105 104  CO2 26 25  GLUCOSE 114* 130*  BUN 14 20  CREATININE 1.13 1.22  CALCIUM 8.7* 8.6*   PT/INR No results for input(s): LABPROT, INR in the last 72 hours. CMP     Component Value Date/Time   NA 138 02/16/2024 0445   K 4.3 02/16/2024 0445   CL 104 02/16/2024 0445   CO2 25 02/16/2024 0445   GLUCOSE 130 (H) 02/16/2024 0445   BUN 20 02/16/2024 0445   CREATININE 1.22 02/16/2024 0445   CALCIUM 8.6 (L) 02/16/2024 0445   PROT 6.2 (L) 02/16/2024 0445   ALBUMIN 3.1 (L) 02/16/2024 0445   AST 43 (H) 02/16/2024 0445   ALT 65 (H) 02/16/2024 0445   ALKPHOS 63 02/16/2024 0445   BILITOT 0.8 02/16/2024 0445   GFRNONAA >60 02/16/2024 0445   Lipase     Component Value Date/Time   LIPASE 790 (H) 02/13/2024 1006       Studies/Results: DG Cholangiogram Operative Result Date: 02/16/2024 CLINICAL DATA:  [id number] Surgery, elective [id number].  PANCREATITIS EXAM: DG CHOLANGIOGRAM OPERATIVE COMPARISON:  CT AP, 02/13/2024.   US  ABDOMEN, 02/13/2024. FLUOROSCOPY: Exposure Index (as provided by the fluoroscopic device): 5.5 mGy Kerma FINDINGS: Limited oblique planar images of the RIGHT upper quadrant obtained C-arm. Images demonstrating laparoscopic instrumentation, cystic duct cannulation and antegrade cholangiogram. The duodenum is outside field-of-view. No biliary ductal dilatation. No evidence of biliary filling defect is demonstrated. Small volume contrast extravasation at the gallbladder fossa during cholangiogram. IMPRESSION: Fluoroscopic imaging for intraoperative cholangiogram. No biliary ductal dilatation or discrete filling defect is demonstrated. For complete description of intra procedural findings, please see performing service dictation. Electronically Signed   By: Thom Hall M.D.   On: 02/16/2024 07:10    Anti-infectives: Anti-infectives (From admission, onward)    Start     Dose/Rate Route Frequency Ordered Stop   02/15/24 1019  sodium chloride  0.9 % with cefTRIAXone  (ROCEPHIN ) ADS Med       Note to Pharmacy: Larina Mays A: cabinet override      02/15/24 1019 02/15/24 1057   02/15/24 0600  cefTRIAXone  (ROCEPHIN ) 2 g in sodium chloride  0.9 % 100 mL IVPB        2 g 200 mL/hr over 30 Minutes Intravenous On call to O.R. 02/14/24 1245 02/15/24 1103        Assessment/Plan POD 1, s/p lap chole for gallstone pancreatitis, Dr. Vanderbilt 02/15/24 -  patient doing great today.  Tolerating a regular diet, pain well controlled, voiding, and mobilizing -surgically stable for DC home -instructions discussed and follow up provided. No narcotics needed as patient does not want as his pain is controlled with Tylenol.   FEN - carb mod VTE - Lovenox ID - no further needed   LOS: 1 day    Burnard FORBES Banter , [facility] 02/16/2024, 8:31 AM Please see Amion for pager number during day hours 7:00am-4:30pm or 7:00am -11:30am on weekends

## 2024-02-16 NOTE — Discharge Summary (Signed)
 Physician Discharge Summary   Patient: [name] MRN: [id number] DOB: [birth year]  Admit date:     02/13/2024  Discharge date: 02/16/24  Discharge Physician: Lebron JINNY Cage   PCP: [facility]   Recommendations at discharge:   Follow-up with general surgery as scheduled Follow-up with PCP in 1 week with repeat labs  Discharge Diagnoses: Principal Problem:   Pancreatitis Active Problems:   ILD (interstitial lung disease) (HCC)   T2DM (type 2 diabetes mellitus) (HCC)   HLD (hyperlipidemia)   Obesity (BMI 30-39.9)    Hospital Course: 68 y.o. male with medical history significant of acute mild pancreatitis 02/2023, interstitial lung disease, hyperlipidemia, type 2 diabetes presenting with progressive abdominal pain with associated nausea and vomiting over the past 1 to 2 days. Found to have acute pancreatitis.    Today, patient denies any new complaints, denies any chest pain, abdominal pain, nausea/vomiting, fever/chills.  Patient very eager to be discharged.  Advised to follow-up with PCP in 1 week with repeat labs as well as general surgery as scheduled.   Assessment and Plan: Acute recurrent mild pancreatitis Similar presentation 02/2023 with outpatient GI follow up and fairly stable workup. Lipase in 700s Triglyceride not elevated, ETOH level low LFTs WNL  CT scan concerning for acute interstitial pancreatitis, biliary sludge and mild GB distension  RUQ u/s showed no cholelithiasis, and normal CBD General Surgery on board, s/p cholecystectomy and IOC on 12/3, no stones noted Outpatient follow-up with PCP, general surgery   T2DM Continue metformin PCP to follow-up pending A1c   HLD (hyperlipidemia) Continue statins   ILD (interstitial lung disease) (HCC) Stable from a respiratory standpoint Continue inhalers   Obesity class II (BMI 30-39.9) BMI 37.4        Consultants: General surgery Procedures performed: Lap  chole Disposition: Home Diet recommendation:  Cardiac and Carb modified diet   DISCHARGE MEDICATION: Allergies as of 02/16/2024       Reactions   Penicillins Other (See Comments)   Per Patient, had a childhood reaction.         Medication List     STOP taking these medications    morphine  15 MG tablet Commonly known as: MSIR   ondansetron  4 MG disintegrating tablet Commonly known as: ZOFRAN -ODT       TAKE these medications    acetaminophen 500 MG tablet Commonly known as: TYLENOL Take 2 tablets (1,000 mg total) by mouth every 6 (six) hours as needed.   atorvastatin 20 MG tablet Commonly known as: LIPITOR Take 1 tablet by mouth daily. What changed: Another medication with the same name was removed. Continue taking this medication, and follow the directions you see here.   betamethasone dipropionate 0.05 % cream Apply topically daily.   metFORMIN 500 MG 24 hr tablet Commonly known as: GLUCOPHAGE-XR Take 500 mg by mouth every evening.        Follow-up Information     Maczis, Puja Gosai, PA-C. Go on 03/13/2024.   Specialty: General Surgery Why: 9:15 AM. Arrive 30 minutes prior to your appointment time, Please bring your insurance card and photo ID Contact information: [address] [facility] [place] [address] [phone]         [facility] Follow up in 1 week(s).   Specialty: Family Medicine Contact information: [address] [place] [address] [phone]  Discharge Exam: Filed Weights   02/13/24 1004 02/15/24 1004  Weight: 125.2 kg 124.7 kg   General: NAD  Cardiovascular: S1, S2 present Respiratory: CTAB Abdomen: Soft, nontender, nondistended, bowel sounds present, lap chole incisions C/D/I Musculoskeletal: No bilateral pedal edema noted Skin: As above Psychiatry: Normal mood   Condition at discharge: stable  The results of significant  diagnostics from this hospitalization (including imaging, microbiology, ancillary and laboratory) are listed below for reference.   Imaging Studies: DG Cholangiogram Operative Result Date: 02/16/2024 CLINICAL DATA:  [id number] Surgery, elective [phone].  PANCREATITIS EXAM: DG CHOLANGIOGRAM OPERATIVE COMPARISON:  CT AP, 02/13/2024.  US  ABDOMEN, 02/13/2024. FLUOROSCOPY: Exposure Index (as provided by the fluoroscopic device): 5.5 mGy Kerma FINDINGS: Limited oblique planar images of the RIGHT upper quadrant obtained C-arm. Images demonstrating laparoscopic instrumentation, cystic duct cannulation and antegrade cholangiogram. The duodenum is outside field-of-view. No biliary ductal dilatation. No evidence of biliary filling defect is demonstrated. Small volume contrast extravasation at the gallbladder fossa during cholangiogram. IMPRESSION: Fluoroscopic imaging for intraoperative cholangiogram. No biliary ductal dilatation or discrete filling defect is demonstrated. For complete description of intra procedural findings, please see performing service dictation. Electronically Signed   By: Thom Hall M.D.   On: 02/16/2024 07:10   US  Abdomen Limited RUQ (LIVER/GB) Result Date: 02/13/2024 EXAM: Right Upper Quadrant Abdominal Ultrasound 02/13/2024 09:16:00 PM TECHNIQUE: Real-time ultrasonography of the right upper quadrant of the abdomen was performed. COMPARISON: Comparison with same day CT abdomen and pelvis. CLINICAL HISTORY: Pancreatitis. FINDINGS: LIVER: Hepatic steatosis with focal fatty sparing along the gallbladder fossa. Patent portal vein with antegrade flow. No intrahepatic biliary dilation. No evidence of mass. BILIARY SYSTEM: No pericholecystic fluid or wall thickening. No cholelithiasis. Common bile duct measures 2 mm. OTHER: No right upper quadrant ascites. Pancreas is not well visualized due to patient's body habitus and overlying bowel gas. IMPRESSION: 1. Hepatic steatosis. 2. No biliary dilation. 3.  Pancreas not well visualized due to body habitus and bowel gas. Electronically signed by: Norman Gatlin MD 02/13/2024 09:44 PM EST RP Workstation: [id number]   CT ABDOMEN PELVIS W CONTRAST Result Date: 02/13/2024 CLINICAL DATA:  elevated lipase, history of pancreatitis, epigastric abd pain. EXAM: CT ABDOMEN AND PELVIS WITH CONTRAST TECHNIQUE: Multidetector CT imaging of the abdomen and pelvis was performed using the standard protocol following bolus administration of intravenous contrast. RADIATION DOSE REDUCTION: This exam was performed according to the departmental dose-optimization program which includes automated exposure control, adjustment of the mA and/or kV according to patient size and/or use of iterative reconstruction technique. CONTRAST:  OMNIPAQUE  IOHEXOL  300 MG/ML  SOLN COMPARISON:  CT scan abdomen and pelvis from 02/22/2023. FINDINGS: Lower chest: There are peripheral/subpleural reticulations with associated bronchiectasis in the visualized bilateral lung bases, concerning for chronic interstitial lung disease. No focal mass, consolidation or pleural effusion. Normal heart size. No pericardial effusion. Hepatobiliary: The liver is normal in size. Non-cirrhotic configuration. No suspicious mass. These is mild diffuse hepatic steatosis. No intrahepatic or extrahepatic bile duct dilation. The gallbladder is physiologically distended. Small to moderate volume dependent gallstones/sludge noted without imaging signs of acute cholecystitis. Pancreas: There is mild fat stranding predominantly surrounding the pancreatic body/tail, in appropriate clinical settings, compatible with acute interstitial pancreatitis. There is no associated pancreatic necrosis or peripancreatic collection. The splenic vein is patent. No focal pancreatic lesion. Main pancreatic duct is not dilated. Spleen: Within normal limits. No focal lesion. Adrenals/Urinary Tract: Adrenal glands are unremarkable. No suspicious renal  mass. No hydronephrosis. No renal or ureteric calculi.  Unremarkable urinary bladder. Stomach/Bowel: There is a small sliding hiatal hernia. No disproportionate dilation of the small or large bowel loops. No evidence of abnormal bowel wall thickening or inflammatory changes. The appendix is unremarkable. There are multiple diverticula mainly in the left hemi colon, without imaging signs of diverticulitis. Vascular/Lymphatic: No ascites or pneumoperitoneum. No abdominal or pelvic lymphadenopathy, by size criteria. No aneurysmal dilation of the major abdominal arteries. There are mild peripheral atherosclerotic vascular calcifications of the aorta and its major branches. Reproductive: Normal size prostate. Symmetric seminal vesicles. Other: There is a tiny fat containing umbilical hernia. The soft tissues and abdominal wall are otherwise unremarkable. Musculoskeletal: No suspicious osseous lesions. There are mild multilevel degenerative changes in the visualized spine. IMPRESSION: 1. Findings compatible with acute interstitial pancreatitis. No associated pancreatic necrosis or peripancreatic collection. 2. Multiple other nonacute observations, as described above. Aortic Atherosclerosis (ICD10-I70.0). Electronically Signed   By: Ree Molt M.D.   On: 02/13/2024 13:56    Microbiology: No results found for this or any previous visit.  Labs: CBC: Recent Labs  Lab 02/13/24 1006 02/14/24 0443 02/16/24 0445  WBC 11.2* 8.3 12.8*  NEUTROABS  --   --  9.3*  HGB 16.6 14.9 14.4  HCT 49.6 45.0 42.7  MCV 85.1 86.0 84.7  PLT 187 162 190   Basic Metabolic Panel: Recent Labs  Lab 02/13/24 1006 02/14/24 0443 02/16/24 0445  NA 136 139 138  K 4.6 4.2 4.3  CL 99 105 104  CO2 25 26 25   GLUCOSE 127* 114* 130*  BUN 15 14 20   CREATININE 0.99 1.13 1.22  CALCIUM 10.3 8.7* 8.6*   Liver Function Tests: Recent Labs  Lab 02/13/24 1006 02/14/24 0443 02/16/24 0445  AST 27 16 43*  ALT 45* 29 65*  ALKPHOS  94 64 63  BILITOT 1.0 1.2 0.8  PROT 7.9 6.3* 6.2*  ALBUMIN 4.4 3.3* 3.1*   CBG: Recent Labs  Lab 02/15/24 1012 02/15/24 1213  GLUCAP 104* 137*    Discharge time spent: greater than 30 minutes.  Signed: Lebron JINNY Cage, MD [facility] 02/16/2024

## 2024-03-21 ENCOUNTER — Other Ambulatory Visit

## 2024-03-22 ENCOUNTER — Ambulatory Visit
Admission: RE | Admit: 2024-03-22 | Discharge: 2024-03-22 | Disposition: A | Discharge status: Home / Self Care | Source: Ambulatory Visit | Attending: Pulmonary Disease | Admitting: Pulmonary Disease

## 2024-03-22 DIAGNOSIS — J849 Interstitial pulmonary disease, unspecified: Secondary | ICD-10-CM

## 2024-04-10 ENCOUNTER — Ambulatory Visit: Payer: Self-pay | Admitting: Pulmonary Disease
